# Patient Record
Sex: Female | Born: 1995
Health system: Southern US, Community
[De-identification: ages and names within clinical notes are randomized; demographics above are authoritative.]

## PROBLEM LIST (undated history)

## (undated) ENCOUNTER — Ambulatory Visit: Disposition: A | Discharge status: Home / Self Care | Payer: Self-pay

## (undated) DIAGNOSIS — D649 Anemia, unspecified: Secondary | ICD-10-CM

## (undated) DIAGNOSIS — R0602 Shortness of breath: Secondary | ICD-10-CM

## (undated) DIAGNOSIS — E282 Polycystic ovarian syndrome: Secondary | ICD-10-CM

## (undated) DIAGNOSIS — J302 Other seasonal allergic rhinitis: Secondary | ICD-10-CM

## (undated) DIAGNOSIS — M2550 Pain in unspecified joint: Secondary | ICD-10-CM

## (undated) DIAGNOSIS — R600 Localized edema: Secondary | ICD-10-CM

## (undated) DIAGNOSIS — R7303 Prediabetes: Secondary | ICD-10-CM

## (undated) DIAGNOSIS — M549 Dorsalgia, unspecified: Secondary | ICD-10-CM

## (undated) DIAGNOSIS — J45909 Unspecified asthma, uncomplicated: Secondary | ICD-10-CM

## (undated) HISTORY — DX: Pain in unspecified joint: M25.50

## (undated) HISTORY — DX: Anemia, unspecified: D64.9

## (undated) HISTORY — DX: Shortness of breath: R06.02

## (undated) HISTORY — DX: Localized edema: R60.0

## (undated) HISTORY — DX: Prediabetes: R73.03

## (undated) HISTORY — DX: Dorsalgia, unspecified: M54.9

---
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
## 2016-01-26 ENCOUNTER — Inpatient Hospital Stay (HOSPITAL_COMMUNITY)
Admission: AD | Admit: 2016-01-26 | Discharge: 2016-01-26 | Disposition: A | Discharge status: Home / Self Care | Payer: Medicaid Other | Source: Ambulatory Visit | Attending: Family Medicine | Admitting: Family Medicine

## 2016-01-26 ENCOUNTER — Encounter (HOSPITAL_COMMUNITY): Payer: Self-pay | Admitting: *Deleted

## 2016-01-26 DIAGNOSIS — Z3202 Encounter for pregnancy test, result negative: Secondary | ICD-10-CM | POA: Insufficient documentation

## 2016-01-26 DIAGNOSIS — M545 Low back pain, unspecified: Secondary | ICD-10-CM

## 2016-01-26 HISTORY — DX: Other seasonal allergic rhinitis: J30.2

## 2016-01-26 HISTORY — DX: Unspecified asthma, uncomplicated: J45.909

## 2016-01-26 LAB — POCT PREGNANCY, URINE: Preg Test, Ur: NEGATIVE
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
## 2016-01-26 NOTE — MAU Note (Signed)
Urine in lab 
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
## 2016-06-23 ENCOUNTER — Emergency Department (HOSPITAL_COMMUNITY): Payer: Medicaid Other

## 2016-06-23 ENCOUNTER — Encounter (HOSPITAL_COMMUNITY): Payer: Self-pay | Admitting: Emergency Medicine

## 2016-06-23 ENCOUNTER — Emergency Department (HOSPITAL_COMMUNITY)
Admission: EM | Admit: 2016-06-23 | Discharge: 2016-06-23 | Disposition: A | Discharge status: Home / Self Care | Payer: Medicaid Other | Attending: Emergency Medicine | Admitting: Emergency Medicine

## 2016-06-23 DIAGNOSIS — B349 Viral infection, unspecified: Secondary | ICD-10-CM | POA: Diagnosis not present

## 2016-06-23 DIAGNOSIS — R51 Headache: Secondary | ICD-10-CM | POA: Diagnosis present

## 2016-06-23 DIAGNOSIS — J111 Influenza due to unidentified influenza virus with other respiratory manifestations: Secondary | ICD-10-CM | POA: Diagnosis not present

## 2016-06-23 DIAGNOSIS — R110 Nausea: Secondary | ICD-10-CM | POA: Diagnosis not present

## 2016-06-23 DIAGNOSIS — R0981 Nasal congestion: Secondary | ICD-10-CM

## 2016-06-23 DIAGNOSIS — R519 Headache, unspecified: Secondary | ICD-10-CM

## 2016-06-23 DIAGNOSIS — R69 Illness, unspecified: Secondary | ICD-10-CM

## 2016-06-23 DIAGNOSIS — J45909 Unspecified asthma, uncomplicated: Secondary | ICD-10-CM | POA: Diagnosis not present

## 2016-06-23 LAB — CBC WITH DIFFERENTIAL/PLATELET
BASOS ABS: 0 10*3/uL (ref 0.0–0.1)
BASOS PCT: 0 %
EOS ABS: 0.1 10*3/uL (ref 0.0–0.7)
EOS PCT: 1 %
HCT: 35.6 % — ABNORMAL LOW (ref 36.0–46.0)
Hemoglobin: 11.5 g/dL — ABNORMAL LOW (ref 12.0–15.0)
Lymphocytes Relative: 16 %
Lymphs Abs: 2.4 10*3/uL (ref 0.7–4.0)
MCH: 24.3 pg — ABNORMAL LOW (ref 26.0–34.0)
MCHC: 32.3 g/dL (ref 30.0–36.0)
MCV: 75.3 fL — AB (ref 78.0–100.0)
Monocytes Absolute: 1.5 10*3/uL — ABNORMAL HIGH (ref 0.1–1.0)
Monocytes Relative: 10 %
Neutro Abs: 11.4 10*3/uL — ABNORMAL HIGH (ref 1.7–7.7)
Neutrophils Relative %: 73 %
PLATELETS: 348 10*3/uL (ref 150–400)
RBC: 4.73 MIL/uL (ref 3.87–5.11)
RDW: 16 % — ABNORMAL HIGH (ref 11.5–15.5)
WBC: 15.4 10*3/uL — AB (ref 4.0–10.5)

## 2016-06-23 LAB — COMPREHENSIVE METABOLIC PANEL
ALBUMIN: 4.1 g/dL (ref 3.5–5.0)
ALK PHOS: 84 U/L (ref 38–126)
ALT: 14 U/L (ref 14–54)
AST: 17 U/L (ref 15–41)
Anion gap: 9 (ref 5–15)
BILIRUBIN TOTAL: 0.4 mg/dL (ref 0.3–1.2)
BUN: 8 mg/dL (ref 6–20)
CALCIUM: 8.9 mg/dL (ref 8.9–10.3)
CO2: 24 mmol/L (ref 22–32)
CREATININE: 0.73 mg/dL (ref 0.44–1.00)
Chloride: 106 mmol/L (ref 101–111)
GFR calc Af Amer: >60 mL/min (ref >60)
GLUCOSE: 103 mg/dL — AB (ref 65–99)
POTASSIUM: 3.4 mmol/L — AB (ref 3.5–5.1)
Sodium: 139 mmol/L (ref 135–145)
TOTAL PROTEIN: 7.7 g/dL (ref 6.5–8.1)

## 2016-06-23 LAB — URINALYSIS, ROUTINE W REFLEX MICROSCOPIC
BILIRUBIN URINE: NEGATIVE
GLUCOSE, UA: NEGATIVE mg/dL
KETONES UR: NEGATIVE mg/dL
LEUKOCYTES UA: NEGATIVE
Nitrite: NEGATIVE
PH: 6 (ref 5.0–8.0)
Protein, ur: 30 mg/dL — AB
Specific Gravity, Urine: 1.018 (ref 1.005–1.030)

## 2016-06-23 LAB — I-STAT BETA HCG BLOOD, ED (MC, WL, AP ONLY): I-stat hCG, quantitative: <5 m[IU]/mL (ref <5)

## 2016-06-23 LAB — TROPONIN I: Troponin I: <0.03 ng/mL (ref <0.03)

## 2016-06-23 LAB — RAPID STREP SCREEN (MED CTR MEBANE ONLY): STREPTOCOCCUS, GROUP A SCREEN (DIRECT): NEGATIVE

## 2016-06-23 MED ORDER — ONDANSETRON 4 MG PO TBDP: 4.0000 mg | ORAL_TABLET | Freq: Three times a day (TID) | ORAL | 0 refills | Status: DC | PRN

## 2016-06-23 MED ORDER — DICYCLOMINE HCL 20 MG PO TABS: 20.0000 mg | ORAL_TABLET | Freq: Two times a day (BID) | ORAL | 0 refills | Status: DC | PRN

## 2016-06-23 MED ORDER — SODIUM CHLORIDE 0.9 % IV BOLUS (SEPSIS)
1000.0000 mL | Freq: Once | INTRAVENOUS | Status: AC
  Administered 2016-06-23: 1000 mL via INTRAVENOUS

## 2016-06-23 MED ORDER — KETOROLAC TROMETHAMINE 30 MG/ML IJ SOLN
30.0000 mg | Freq: Once | INTRAMUSCULAR | Status: AC
  Administered 2016-06-23: 30 mg via INTRAVENOUS
  Filled 2016-06-23: qty 1

## 2016-06-23 NOTE — ED Triage Notes (Signed)
Pt c/o sudden onset temporal bilateral head pain, post-auricular pain, severe pain with swallowing, chills and sweating at night, epistaxis, low back spasms, LLQ abdominal pain onset Monday some emesis and diarrhea yesterday.

## 2016-06-23 NOTE — ED Notes (Signed)
Pt is aware that a urine sample is needed but is unable to obtain one at this time. 

## 2016-06-23 NOTE — ED Provider Notes (Signed)
[facility]-EMERGENCY DEPT Provider Note   CSN: [id number] Arrival date & time: 06/23/16  0707     History   Chief Complaint Chief Complaint  Patient presents with  . Headache  . Abdominal Pain  . Sore Throat    HPI [name] is a 21 y.o. female.  HPI  Frontal headache and pain to ears. If turn head it hurts. Severe pain with swallowing.  Congestion severe.  Chills and back spasms, left sided abdominal pain.  Started on Monday.  No fevers measured but has had chills.  Symptoms gradually worsening.  No cough but feels dyspnea, feels like when she is going to start wheezing. Normally if start wheezing needs to cough but this feel same.  Reports congestion.  No sick contacts.  Abdominal pain feels like cramps, also had some diarrhea yesterday.  Hurts to swallow, trying to stay hydrated but difficult.  Took some ibuprofen.          Past Medical History:  Diagnosis Date  . Asthma   . PCOS (polycystic ovarian syndrome)   . Seasonal allergies     There are no active problems to display for this patient.   Past Surgical History:  Procedure Laterality Date  . FEMUR FRACTURE SURGERY  2010    OB History    Gravida Para Term Preterm AB Living   0 0 0 0 0 0   SAB TAB Ectopic Multiple Live Births   0 0 0 0         Home Medications    Prior to Admission medications   Medication Sig Start Date End Date Taking? Authorizing Provider  albuterol (PROVENTIL HFA;VENTOLIN HFA) 108 (90 Base) MCG/ACT inhaler Inhale 2 puffs into the lungs every 6 (six) hours as needed for wheezing or shortness of breath.   Yes [provider]  ibuprofen (ADVIL,MOTRIN) 200 MG tablet Take 1,000 mg by mouth every 6 (six) hours as needed for fever, headache, mild pain, moderate pain or cramping.   Yes [provider]  dicyclomine (BENTYL) 20 MG tablet Take 1 tablet (20 mg total) by mouth 2 (two) times daily as needed for spasms (abdominal pain). 06/23/16   Alvira MondaySchlossman, Shaunie Boehm, MD    norgestimate-ethinyl estradiol (ORTHO-CYCLEN,SPRINTEC,PREVIFEM) 0.25-35 MG-MCG tablet Take 1 tablet by mouth daily. Patient not taking: Reported on 06/23/2016 07/10/15   Constant, Peggy, MD  ondansetron (ZOFRAN ODT) 4 MG disintegrating tablet Take 1 tablet (4 mg total) by mouth every 8 (eight) hours as needed for nausea or vomiting. 06/23/16   Alvira MondaySchlossman, Rasa Degrazia, MD    Family History Family History  Problem Relation Age of Onset  . Diabetes Mother   . Diabetes Father   . Hypertension Father   . Cancer Other     Social History Social History  Substance Use Topics  . Smoking status: Never Smoker  . Smokeless tobacco: Never Used  . Alcohol use No     Allergies   Patient has no known allergies.   Review of Systems Review of Systems  Constitutional: Positive for appetite change, chills and fatigue. Negative for fever.  HENT: Positive for congestion (yellow-green, bloody) and sore throat.   Eyes: Negative for visual disturbance.  Respiratory: Positive for shortness of breath. Negative for cough.   Cardiovascular: Negative for chest pain.  Gastrointestinal: Positive for abdominal pain, diarrhea, nausea and vomiting (yesterday). Negative for constipation.  Genitourinary: Positive for vaginal bleeding (on menses). Negative for difficulty urinating and vaginal discharge.  Musculoskeletal: Negative for back pain and neck  pain.  Skin: Negative for rash.  Neurological: Negative for syncope and headaches.     Physical Exam Updated Vital Signs BP 116/68 (BP Location: Right Arm)   Pulse 78   Temp 98.6 F (37 C) (Oral)   Resp 16   Ht 5\' 2"  (1.575 m)   Wt 280 lb (127 kg)   LMP 06/22/2016   SpO2 100%   BMI 51.21 kg/m   Physical Exam  Constitutional: She is oriented to person, place, and time. She appears well-developed and well-nourished. No distress.  HENT:  Head: Normocephalic and atraumatic.  Eyes: Conjunctivae and EOM are normal.  Neck: Normal range of motion.   Cardiovascular: Normal rate, regular rhythm, normal heart sounds and intact distal pulses.  Exam reveals no gallop and no friction rub.   No murmur heard. Pulmonary/Chest: Effort normal and breath sounds normal. No respiratory distress. She has no wheezes. She has no rales.  Abdominal: Soft. She exhibits no distension. There is no tenderness. There is no guarding.  Musculoskeletal: She exhibits no edema or tenderness.  Neurological: She is alert and oriented to person, place, and time.  Skin: Skin is warm and dry. No rash noted. She is not diaphoretic. No erythema.  Nursing note and vitals reviewed.    ED Treatments / Results  Labs (all labs ordered are listed, but only abnormal results are displayed) Labs Reviewed  COMPREHENSIVE METABOLIC PANEL - Abnormal; Notable for the following:       Result Value   Potassium 3.4 (*)    Glucose, Bld 103 (*)    All other components within normal limits  CBC WITH DIFFERENTIAL/PLATELET - Abnormal; Notable for the following:    WBC 15.4 (*)    Hemoglobin 11.5 (*)    HCT 35.6 (*)    MCV 75.3 (*)    MCH 24.3 (*)    RDW 16.0 (*)    Neutro Abs 11.4 (*)    Monocytes Absolute 1.5 (*)    All other components within normal limits  URINALYSIS, ROUTINE W REFLEX MICROSCOPIC - Abnormal; Notable for the following:    APPearance HAZY (*)    Hgb urine dipstick LARGE (*)    Protein, ur 30 (*)    Bacteria, UA RARE (*)    Squamous Epithelial / LPF 0-5 (*)    All other components within normal limits  RAPID STREP SCREEN (NOT AT [facility])  CULTURE, GROUP A STREP [facility])  TROPONIN I  I-STAT BETA HCG BLOOD, ED ([facility], [facility], [facility] ONLY)    EKG  EKG Interpretation  Date/Time:  Wednesday Jun 23 2016 10:45:00 EDT Ventricular Rate:  98 PR Interval:    QRS Duration: 82 QT Interval:  427 QTC Calculation: 546 R Axis:   66 Text Interpretation:  Sinus rhythm Borderline T abnormalities, anterior leads Prolonged QT interval No significant change since last tracing Confirmed  by [facility] MD, Nichael Ehly ([id number]) on 06/23/2016 10:51:07 AM       Radiology Dg Chest 2 View  Result Date: 06/23/2016 CLINICAL DATA:  Short of breath EXAM: CHEST  2 VIEW COMPARISON:  05/03/2013 FINDINGS: Cardiac enlargement with normal vascularity. Lungs are clear without infiltrate effusion or mass. IMPRESSION: Cardiac enlargement.  No acute abnormality. Electronically Signed   By: Marlan Palau M.D.   On: 06/23/2016 09:18    Procedures Procedures (including critical care time)  Medications Ordered in ED Medications  sodium chloride 0.9 % bolus 1,000 mL (0 mLs Intravenous Stopped 06/23/16 1229)  ketorolac (TORADOL) 30 MG/ML injection 30 mg (  30 mg Intravenous Given 06/23/16 0901)     Initial Impression / Assessment and Plan / ED Course  I have reviewed the triage vital signs and the nursing notes.  Pertinent labs & imaging results that were available during my care of the patient were reviewed by me and considered in my medical decision making (see chart for details).     21 year old female with a history of asthma and PCOS presents with concern for chills, congestion, headache abdominal pain, nausea, diarrhea and back pain. Have low suspicion for meningitis given full range of motion of the neck, no nuchal rigidity, negative Kernig and Brudzinski's, normal mentation, multiple other symptoms. No sign of pneumonia or UTI on XR or urinalysis.  Strep screen done and negative.  Initial tachycardia which improved with fluids, likely secondary to dehydration. EKG and troponin show no acute abnormalities, no sign myocarditis. Given duration of symptoms, lack of myalgias, feel treatment with tamiflu for possible influenza not indicated.  Abdominal exam benign, hx not consistent with appendicitis, diverticulitis, TOA< PID, torsion, nephrolithiasis.  Pt likely with viral syndrome given constellation of symptoms. Given rx for bentyl, zofran. Patient discharged in stable condition with understanding of reasons  to return.   Final Clinical Impressions(s) / ED Diagnoses   Final diagnoses:  Viral syndrome  Influenza-like illness  Nausea  Acute nonintractable headache, unspecified headache type  Nasal congestion    New Prescriptions Discharge Medication List as of 06/23/2016 12:25 PM    START taking these medications   Details  dicyclomine (BENTYL) 20 MG tablet Take 1 tablet (20 mg total) by mouth 2 (two) times daily as needed for spasms (abdominal pain)., Starting Wed 06/23/2016, Print    ondansetron (ZOFRAN ODT) 4 MG disintegrating tablet Take 1 tablet (4 mg total) by mouth every 8 (eight) hours as needed for nausea or vomiting., Starting Wed 06/23/2016, Print         Alvira Monday, MD 06/24/16 [phone]

## 2016-06-25 LAB — CULTURE, GROUP A STREP (THRC)

## 2016-07-25 ENCOUNTER — Emergency Department (HOSPITAL_COMMUNITY)
Admission: EM | Admit: 2016-07-25 | Discharge: 2016-07-26 | Disposition: A | Discharge status: Home / Self Care | Payer: Medicaid Other | Attending: Emergency Medicine | Admitting: Emergency Medicine

## 2016-07-25 ENCOUNTER — Encounter (HOSPITAL_COMMUNITY): Payer: Self-pay

## 2016-07-25 DIAGNOSIS — R109 Unspecified abdominal pain: Secondary | ICD-10-CM | POA: Diagnosis present

## 2016-07-25 DIAGNOSIS — J45909 Unspecified asthma, uncomplicated: Secondary | ICD-10-CM | POA: Insufficient documentation

## 2016-07-25 DIAGNOSIS — Z791 Long term (current) use of non-steroidal anti-inflammatories (NSAID): Secondary | ICD-10-CM | POA: Diagnosis not present

## 2016-07-25 DIAGNOSIS — R1084 Generalized abdominal pain: Secondary | ICD-10-CM | POA: Diagnosis not present

## 2016-07-25 DIAGNOSIS — Z793 Long term (current) use of hormonal contraceptives: Secondary | ICD-10-CM | POA: Diagnosis not present

## 2016-07-25 LAB — COMPREHENSIVE METABOLIC PANEL
ALT: 17 U/L (ref 14–54)
AST: 15 U/L (ref 15–41)
Albumin: 4.1 g/dL (ref 3.5–5.0)
Alkaline Phosphatase: 83 U/L (ref 38–126)
Anion gap: 7 (ref 5–15)
BUN: 14 mg/dL (ref 6–20)
CHLORIDE: 103 mmol/L (ref 101–111)
CO2: 28 mmol/L (ref 22–32)
CREATININE: 0.75 mg/dL (ref 0.44–1.00)
Calcium: 8.7 mg/dL — ABNORMAL LOW (ref 8.9–10.3)
GFR calc Af Amer: >60 mL/min (ref >60)
Glucose, Bld: 102 mg/dL — ABNORMAL HIGH (ref 65–99)
Potassium: 3.9 mmol/L (ref 3.5–5.1)
Sodium: 138 mmol/L (ref 135–145)
TOTAL PROTEIN: 8.1 g/dL (ref 6.5–8.1)
Total Bilirubin: 0.5 mg/dL (ref 0.3–1.2)

## 2016-07-25 LAB — URINALYSIS, ROUTINE W REFLEX MICROSCOPIC
Bilirubin Urine: NEGATIVE
Glucose, UA: NEGATIVE mg/dL
Hgb urine dipstick: NEGATIVE
KETONES UR: NEGATIVE mg/dL
LEUKOCYTES UA: NEGATIVE
NITRITE: NEGATIVE
PROTEIN: NEGATIVE mg/dL
Specific Gravity, Urine: 1.032 — ABNORMAL HIGH (ref 1.005–1.030)
pH: 6 (ref 5.0–8.0)

## 2016-07-25 LAB — CBC
HCT: 35.9 % — ABNORMAL LOW (ref 36.0–46.0)
Hemoglobin: 11.6 g/dL — ABNORMAL LOW (ref 12.0–15.0)
MCH: 24.3 pg — ABNORMAL LOW (ref 26.0–34.0)
MCHC: 32.3 g/dL (ref 30.0–36.0)
MCV: 75.3 fL — AB (ref 78.0–100.0)
PLATELETS: 340 10*3/uL (ref 150–400)
RBC: 4.77 MIL/uL (ref 3.87–5.11)
RDW: 15.7 % — AB (ref 11.5–15.5)
WBC: 9.5 10*3/uL (ref 4.0–10.5)

## 2016-07-25 LAB — LIPASE, BLOOD: LIPASE: 17 U/L (ref 11–51)

## 2016-07-25 LAB — POC URINE PREG, ED: PREG TEST UR: NEGATIVE

## 2016-07-25 NOTE — ED Triage Notes (Signed)
States since Friday abdominal pain and back pain with dysuria voiced alert and oriented x 3 no vaginal discharge no fever noted.

## 2016-07-25 NOTE — ED Notes (Signed)
Bed: WA05 Expected date:  Expected time:  Means of arrival:  Comments: 

## 2016-07-26 ENCOUNTER — Emergency Department (HOSPITAL_COMMUNITY): Payer: Medicaid Other

## 2016-07-26 ENCOUNTER — Encounter (HOSPITAL_COMMUNITY): Payer: Self-pay

## 2016-07-26 MED ORDER — FAMOTIDINE IN NACL 20-0.9 MG/50ML-% IV SOLN
20.0000 mg | Freq: Once | INTRAVENOUS | Status: AC
  Administered 2016-07-26: 20 mg via INTRAVENOUS
  Filled 2016-07-26: qty 50

## 2016-07-26 MED ORDER — IOPAMIDOL (ISOVUE-300) INJECTION 61%
INTRAVENOUS | Status: AC
  Administered 2016-07-26: 100 mL via INTRAVENOUS
  Filled 2016-07-26: qty 100

## 2016-07-26 MED ORDER — PROMETHAZINE HCL 25 MG PO TABS: 25.0000 mg | ORAL_TABLET | Freq: Three times a day (TID) | ORAL | 0 refills | Status: DC | PRN

## 2016-07-26 MED ORDER — TRAMADOL HCL 50 MG PO TABS: 50.0000 mg | ORAL_TABLET | Freq: Four times a day (QID) | ORAL | 0 refills | Status: DC | PRN

## 2016-07-26 MED ORDER — METHYLPREDNISOLONE SODIUM SUCC 125 MG IJ SOLR
125.0000 mg | Freq: Once | INTRAMUSCULAR | Status: AC
  Administered 2016-07-26: 125 mg via INTRAVENOUS
  Filled 2016-07-26: qty 2

## 2016-07-26 MED ORDER — EPINEPHRINE 0.3 MG/0.3ML IJ SOAJ
INTRAMUSCULAR | Status: AC
  Administered 2016-07-26: 0.3 mg
  Filled 2016-07-26: qty 0.3

## 2016-07-26 MED ORDER — DIPHENHYDRAMINE HCL 50 MG/ML IJ SOLN
25.0000 mg | Freq: Once | INTRAMUSCULAR | Status: AC
  Administered 2016-07-26: 25 mg via INTRAVENOUS
  Filled 2016-07-26: qty 1

## 2016-07-26 MED ORDER — EPINEPHRINE 0.3 MG/0.3ML IJ SOAJ
0.3000 mg | INTRAMUSCULAR | Status: AC
  Administered 2016-07-26: 0.3 mg via INTRAMUSCULAR

## 2016-07-26 MED ORDER — SODIUM CHLORIDE 0.9 % IV BOLUS (SEPSIS)
1000.0000 mL | Freq: Once | INTRAVENOUS | Status: AC
Start: 2016-07-26 — End: 2016-07-26
  Administered 2016-07-26: 1000 mL via INTRAVENOUS

## 2016-07-26 MED ORDER — PREDNISONE 50 MG PO TABS: 50.0000 mg | ORAL_TABLET | Freq: Every day | ORAL | 0 refills | Status: DC

## 2016-07-26 MED ORDER — IOPAMIDOL (ISOVUE-300) INJECTION 61%
100.0000 mL | Freq: Once | INTRAVENOUS | Status: AC | PRN
  Administered 2016-07-26: 100 mL via INTRAVENOUS

## 2016-07-26 NOTE — Discharge Instructions (Signed)
Return here as needed.  Follow-up with your primary doctor. °

## 2016-07-26 NOTE — ED Provider Notes (Signed)
[facility]-EMERGENCY DEPT Provider Note   CSN: [id number] Arrival date & time: 07/25/16  2053     History   Chief Complaint Chief Complaint  Patient presents with  . Abdominal Pain    HPI [name] is a 21 y.o. female.  HPI Patient presents to the emergency department with abdominal pain started 2 days ago.  Patient states that she has also had some back discomfort with dysuria.  Patient, states she has not had any vaginal bleeding or discharge.  Patient states that she has diffuse abdominal pain and is not one specific area that she can pinpoint that is worse painful than another. The patient denies chest pain, shortness of breath, headache,blurred vision, neck pain, fever, cough, weakness, numbness, dizziness, anorexia, edema,nausea, vomiting, diarrhea, rash, back pain, dysuria, hematemesis, bloody stool, near syncope, or syncope.  Patient did not take any medications prior to arrival for her symptoms.  Nothing seems make the condition better or worse Past Medical History:  Diagnosis Date  . Asthma   . PCOS (polycystic ovarian syndrome)   . Seasonal allergies     There are no active problems to display for this patient.   Past Surgical History:  Procedure Laterality Date  . FEMUR FRACTURE SURGERY  2010    OB History    Gravida Para Term Preterm AB Living   0 0 0 0 0 0   SAB TAB Ectopic Multiple Live Births   0 0 0 0         Home Medications    Prior to Admission medications   Medication Sig Start Date End Date Taking? Authorizing Provider  albuterol (PROVENTIL HFA;VENTOLIN HFA) 108 (90 Base) MCG/ACT inhaler Inhale 2 puffs into the lungs every 6 (six) hours as needed for wheezing or shortness of breath.   Yes [provider]  ibuprofen (ADVIL,MOTRIN) 200 MG tablet Take 1,000 mg by mouth every 6 (six) hours as needed for fever, headache, mild pain, moderate pain or cramping.   Yes [provider]  dicyclomine (BENTYL) 20 MG tablet Take 1 tablet (20  mg total) by mouth 2 (two) times daily as needed for spasms (abdominal pain). Patient not taking: Reported on 07/26/2016 06/23/16   Alvira Monday, MD  norgestimate-ethinyl estradiol (ORTHO-CYCLEN,SPRINTEC,PREVIFEM) 0.25-35 MG-MCG tablet Take 1 tablet by mouth daily. Patient not taking: Reported on 06/23/2016 07/10/15   Constant, Peggy, MD  ondansetron (ZOFRAN ODT) 4 MG disintegrating tablet Take 1 tablet (4 mg total) by mouth every 8 (eight) hours as needed for nausea or vomiting. Patient not taking: Reported on 07/26/2016 06/23/16   Alvira Monday, MD  predniSONE (DELTASONE) 50 MG tablet Take 1 tablet (50 mg total) by mouth daily. 07/26/16   Emmelyn Schmale, Cristal Deer, PA-C  promethazine (PHENERGAN) 25 MG tablet Take 1 tablet (25 mg total) by mouth every 8 (eight) hours as needed for nausea or vomiting. 07/26/16   Angellica Maddison, Cristal Deer, PA-C  traMADol (ULTRAM) 50 MG tablet Take 1 tablet (50 mg total) by mouth every 6 (six) hours as needed for severe pain. 07/26/16   Charlestine Night, PA-C    Family History Family History  Problem Relation Age of Onset  . Diabetes Mother   . Diabetes Father   . Hypertension Father   . Cancer Other     Social History Social History  Substance Use Topics  . Smoking status: Never Smoker  . Smokeless tobacco: Never Used  . Alcohol use No     Allergies   Isovue [iopamidol]   Review of  Systems Review of Systems  All other systems negative except as documented in the HPI. All pertinent positives and negatives as reviewed in the HPI. Physical Exam Updated Vital Signs BP 128/66 (BP Location: Right Arm)   Pulse (!) 102   Temp 98.2 F (36.8 C) (Oral)   Resp 18   Ht 5\' 2"  (1.575 m)   Wt 127 kg (280 lb)   SpO2 97%   BMI 51.21 kg/m   Physical Exam  Constitutional: She is oriented to person, place, and time. She appears well-developed and well-nourished. No distress.  HENT:  Head: Normocephalic and atraumatic.  Mouth/Throat: Oropharynx is clear and moist.    Eyes: Pupils are equal, round, and reactive to light.  Neck: Normal range of motion. Neck supple.  Cardiovascular: Normal rate, regular rhythm and normal heart sounds.  Exam reveals no gallop and no friction rub.   No murmur heard. Pulmonary/Chest: Effort normal and breath sounds normal. No respiratory distress. She has no wheezes.  Abdominal: Soft. Normal appearance and bowel sounds are normal. She exhibits no distension and no mass. There is generalized tenderness. There is no rigidity and no guarding.  Neurological: She is alert and oriented to person, place, and time. She exhibits normal muscle tone. Coordination normal.  Skin: Skin is warm and dry. Capillary refill takes less than 2 seconds. No rash noted. No erythema.  Psychiatric: She has a normal mood and affect. Her behavior is normal.  Nursing note and vitals reviewed.    ED Treatments / Results  Labs (all labs ordered are listed, but only abnormal results are displayed) Labs Reviewed  COMPREHENSIVE METABOLIC PANEL - Abnormal; Notable for the following:       Result Value   Glucose, Bld 102 (*)    Calcium 8.7 (*)    All other components within normal limits  CBC - Abnormal; Notable for the following:    Hemoglobin 11.6 (*)    HCT 35.9 (*)    MCV 75.3 (*)    MCH 24.3 (*)    RDW 15.7 (*)    All other components within normal limits  URINALYSIS, ROUTINE W REFLEX MICROSCOPIC - Abnormal; Notable for the following:    Specific Gravity, Urine 1.032 (*)    All other components within normal limits  LIPASE, BLOOD  POC URINE PREG, ED    EKG  EKG Interpretation  Date/Time:  Sunday July 25 2016 22:02:21 EDT Ventricular Rate:  99 PR Interval:    QRS Duration: 85 QT Interval:  335 QTC Calculation: 430 R Axis:   55 Text Interpretation:  Sinus rhythm borderline T waves. no significant change since Jun 23 2016 Confirmed by Pricilla LovelessGoldston, Scott [phone]([id number]) on 07/26/2016 1:48:55 AM       Radiology Ct Abdomen Pelvis W  Contrast  Result Date: 07/26/2016 CLINICAL DATA:  Abdominal and back pain for 3 days with dysuria EXAM: CT ABDOMEN AND PELVIS WITH CONTRAST TECHNIQUE: Multidetector CT imaging of the abdomen and pelvis was performed using the standard protocol following bolus administration of intravenous contrast. Limited axial views in the delayed phase were obtained for patient motion. CONTRAST:  100 cc Isovue 300 intravenous COMPARISON:  05/10/2015 FINDINGS: Lower chest: Lung bases demonstrate no acute consolidation or effusion. The heart is upper normal in size. Hepatobiliary: No focal liver abnormality is seen. No gallstones, gallbladder wall thickening, or biliary dilatation. Pancreas: Unremarkable. No pancreatic ductal dilatation or surrounding inflammatory changes. Spleen: Normal in size without focal abnormality. Adrenals/Urinary Tract: Respiratory motion artifact obscures the lower  poles. Adrenal glands are within normal limits. No hydronephrosis. The bladder is normal. Stomach/Bowel: Stomach is within normal limits. Appendix appears normal. No evidence of bowel wall thickening, distention, or inflammatory changes. Central abdomen slightly obscured by respiratory motion. Vascular/Lymphatic: No significant vascular findings are present. No enlarged abdominal or pelvic lymph nodes. Reproductive: Uterus and bilateral adnexa are unremarkable. Other: No free air or free fluid. Musculoskeletal: No acute or significant osseous findings. IMPRESSION: Negative for acute intra-abdominal or pelvic pathology. Examination is slightly compromised by patient motion. Electronically Signed   By: Jasmine Pang M.D.   On: 07/26/2016 01:21    Procedures Procedures (including critical care time)  Medications Ordered in ED Medications  iopamidol (ISOVUE-300) 61 % injection 100 mL (100 mLs Intravenous Contrast Given 07/26/16 0037)  sodium chloride 0.9 % bolus 1,000 mL (0 mLs Intravenous Stopped 07/26/16 0139)  famotidine (PEPCID) IVPB  20 mg premix (0 mg Intravenous Stopped 07/26/16 0142)  EPINEPHrine (EPI-PEN) injection 0.3 mg (0.3 mg Intramuscular Given 07/26/16 0112)  methylPREDNISolone sodium succinate (SOLU-MEDROL) 125 mg/2 mL injection 125 mg (125 mg Intravenous Given 07/26/16 0111)  diphenhydrAMINE (BENADRYL) injection 25 mg (25 mg Intravenous Given 07/26/16 0109)  EPINEPHrine (EPI-PEN) 0.3 mg/0.3 mL injection (0.3 mg  Given 07/26/16 0101)     Initial Impression / Assessment and Plan / ED Course  I have reviewed the triage vital signs and the nursing notes.  Pertinent labs & imaging results that were available during my care of the patient were reviewed by me and considered in my medical decision making (see chart for details).     Patient had what appeared to be neurologically reaction to the IV contrast was observed over many hours.  She is given an EpiPen due to the fact that she was feeling like she is having, throat closing, and her voice seemed to change.  Patient was also given Solu-Medrol, Pepcid, Benadryl.  Patient was stable for discharge.  There is no definitive reason for abdominal pain identified on her testing or imaging  Final Clinical Impressions(s) / ED Diagnoses   Final diagnoses:  Generalized abdominal pain    New Prescriptions Discharge Medication List as of 07/26/2016  3:02 AM    START taking these medications   Details  predniSONE (DELTASONE) 50 MG tablet Take 1 tablet (50 mg total) by mouth daily., Starting Mon 07/26/2016, Print    promethazine (PHENERGAN) 25 MG tablet Take 1 tablet (25 mg total) by mouth every 8 (eight) hours as needed for nausea or vomiting., Starting Mon 07/26/2016, Print    traMADol (ULTRAM) 50 MG tablet Take 1 tablet (50 mg total) by mouth every 6 (six) hours as needed for severe pain., Starting Mon 07/26/2016, Print         Shaianne Nucci, [place], PA-C 07/26/16 1610    Pricilla Loveless, MD 07/28/16 [phone]

## 2017-03-23 ENCOUNTER — Emergency Department (HOSPITAL_COMMUNITY)
Admission: EM | Admit: 2017-03-23 | Discharge: 2017-03-23 | Disposition: A | Discharge status: Home / Self Care | Payer: Self-pay | Attending: Emergency Medicine | Admitting: Emergency Medicine

## 2017-03-23 ENCOUNTER — Emergency Department (HOSPITAL_COMMUNITY): Payer: Self-pay

## 2017-03-23 ENCOUNTER — Encounter (HOSPITAL_COMMUNITY): Payer: Self-pay | Admitting: Emergency Medicine

## 2017-03-23 DIAGNOSIS — R0602 Shortness of breath: Secondary | ICD-10-CM | POA: Insufficient documentation

## 2017-03-23 DIAGNOSIS — R42 Dizziness and giddiness: Secondary | ICD-10-CM | POA: Insufficient documentation

## 2017-03-23 DIAGNOSIS — J40 Bronchitis, not specified as acute or chronic: Secondary | ICD-10-CM | POA: Insufficient documentation

## 2017-03-23 DIAGNOSIS — R05 Cough: Secondary | ICD-10-CM | POA: Insufficient documentation

## 2017-03-23 DIAGNOSIS — R091 Pleurisy: Secondary | ICD-10-CM | POA: Insufficient documentation

## 2017-03-23 LAB — CBC
HEMATOCRIT: 37.8 % (ref 36.0–46.0)
Hemoglobin: 12.4 g/dL (ref 12.0–15.0)
MCH: 25.1 pg — ABNORMAL LOW (ref 26.0–34.0)
MCHC: 32.8 g/dL (ref 30.0–36.0)
MCV: 76.4 fL — AB (ref 78.0–100.0)
Platelets: 345 10*3/uL (ref 150–400)
RBC: 4.95 MIL/uL (ref 3.87–5.11)
RDW: 15.7 % — AB (ref 11.5–15.5)
WBC: 6.3 10*3/uL (ref 4.0–10.5)

## 2017-03-23 LAB — BASIC METABOLIC PANEL
Anion gap: 7 (ref 5–15)
BUN: 13 mg/dL (ref 6–20)
CHLORIDE: 104 mmol/L (ref 101–111)
CO2: 26 mmol/L (ref 22–32)
CREATININE: 0.68 mg/dL (ref 0.44–1.00)
Calcium: 9.1 mg/dL (ref 8.9–10.3)
GFR calc Af Amer: >60 mL/min (ref >60)
GFR calc non Af Amer: >60 mL/min (ref >60)
Glucose, Bld: 99 mg/dL (ref 65–99)
POTASSIUM: 3.8 mmol/L (ref 3.5–5.1)
Sodium: 137 mmol/L (ref 135–145)

## 2017-03-23 LAB — RAPID URINE DRUG SCREEN, HOSP PERFORMED
AMPHETAMINES: NOT DETECTED
Barbiturates: NOT DETECTED
Benzodiazepines: NOT DETECTED
COCAINE: NOT DETECTED
OPIATES: NOT DETECTED
Tetrahydrocannabinol: NOT DETECTED

## 2017-03-23 LAB — I-STAT BETA HCG BLOOD, ED (MC, WL, AP ONLY): I-stat hCG, quantitative: <5 m[IU]/mL (ref <5)

## 2017-03-23 LAB — I-STAT TROPONIN, ED
Troponin i, poc: 0 ng/mL (ref 0.00–0.08)
Troponin i, poc: 0.01 ng/mL (ref 0.00–0.08)

## 2017-03-23 LAB — D-DIMER, QUANTITATIVE: D-Dimer, Quant: <0.27 ug/mL-FEU (ref 0.00–0.50)

## 2017-03-23 LAB — TSH: TSH: 1.876 u[IU]/mL (ref 0.350–4.500)

## 2017-03-23 MED ORDER — PREDNISONE 50 MG PO TABS: 50.0000 mg | ORAL_TABLET | Freq: Every day | ORAL | 0 refills | Status: DC

## 2017-03-23 MED ORDER — KETOROLAC TROMETHAMINE 15 MG/ML IJ SOLN
15.0000 mg | Freq: Once | INTRAMUSCULAR | Status: AC
  Administered 2017-03-23: 15 mg via INTRAVENOUS
  Filled 2017-03-23: qty 1

## 2017-03-23 MED ORDER — ALBUTEROL SULFATE HFA 108 (90 BASE) MCG/ACT IN AERS
1.0000 | INHALATION_SPRAY | Freq: Four times a day (QID) | RESPIRATORY_TRACT | Status: DC | PRN
  Administered 2017-03-23: 2 via RESPIRATORY_TRACT
  Filled 2017-03-23: qty 6.7

## 2017-03-23 MED ORDER — PREDNISONE 50 MG PO TABS
50.0000 mg | ORAL_TABLET | Freq: Once | ORAL | Status: AC
  Administered 2017-03-23: 50 mg via ORAL
  Filled 2017-03-23: qty 1

## 2017-03-23 MED ORDER — NAPROXEN 500 MG PO TABS: 500.0000 mg | ORAL_TABLET | Freq: Two times a day (BID) | ORAL | 0 refills | Status: DC

## 2017-03-23 NOTE — ED Notes (Addendum)
Patient c/o chest pain.  ED Provider at bedside.

## 2017-03-23 NOTE — ED Notes (Signed)
Patient transported to X-ray 

## 2017-03-23 NOTE — ED Notes (Signed)
Patient's HR became irregular and rapid, then suddenly slowed. Highest HR 145, with normal HR of 95. EDP made aware. Patient Sinus tach during irregularity. BP also noted to be elevated, differing from earlier. EDP made aware. New orders for TSH.

## 2017-03-23 NOTE — ED Triage Notes (Signed)
Patient here from home with complaints of chest pain. Reports being woken out of sleep with chest pain non radiating, chills, dizziness when standing and walking, and cough that started 2 days ago. Diarrhea.

## 2017-03-23 NOTE — ED Notes (Signed)
Patient complaining of new onset headache. EDP made aware. No new orders given.

## 2017-03-23 NOTE — Discharge Instructions (Signed)
Follow-up with the primary care doctor as we discussed, take the medications as prescribed, return as needed for worsening symptoms

## 2017-03-23 NOTE — ED Provider Notes (Signed)
Pt initially seen by Dr Rhunette CroftNanavati.  Please see his note.   Patient presented to the emergency room for evaluation of cough congestion and chest pain.  Patient describes sharp pleuritic chest pain.  On my exam after treatment she had a very old wheeze noted.  Patient's laboratory tests are reassuring.  D-dimer is negative.  Chest x-ray without pneumonia.  I doubt pulmonary embolism, pneumonia or acute coronary syndrome.  Symptoms most likely related to a viral illness triggering some bronchospasm.  Plan on discharge home with inhaler, steroids and anti-inflammatory agents   [name], Tammy Fore, MD 03/23/17 1042

## 2017-03-23 NOTE — ED Notes (Signed)
Patient unable to provide UA at present.

## 2017-03-31 NOTE — ED Provider Notes (Signed)
[facility]-EMERGENCY DEPT Provider Note   CSN: [id number] Arrival date & time: 03/23/17  0424     History   Chief Complaint Chief Complaint  Patient presents with  . Chest Pain  . Cough  . Chills  . Dizziness    HPI [name] is a 22 y.o. female.  HPI 22 year old female comes in with chief complaint of shortness of breath and chest pain.  Patient has been having chest pain that is worse with inspiration over the past 2 days, and she has associated dry cough.  Patient is also having dizziness when she is walking, but she denies any near fainting.  Review of system is positive for shortness of breath, mostly with exertion.  Patient has history of PCOS.  Patient is not any birth control or hormonal therapy. Pt has no hx of PE, DVT and denies any exogenous hormone (testosterone / estrogen) use, long distance travels or surgery in the past 6 weeks, active cancer, recent immobilization. Patient also denies any flulike symptoms, outside of the cough.  Patient's chest pain is not positional.  Past Medical History:  Diagnosis Date  . Asthma   . PCOS (polycystic ovarian syndrome)   . Seasonal allergies     There are no active problems to display for this patient.   Past Surgical History:  Procedure Laterality Date  . FEMUR FRACTURE SURGERY  2010    OB History    Gravida Para Term Preterm AB Living   0 0 0 0 0 0   SAB TAB Ectopic Multiple Live Births   0 0 0 0         Home Medications    Prior to Admission medications   Medication Sig Start Date End Date Taking? Authorizing Provider  ibuprofen (ADVIL,MOTRIN) 200 MG tablet Take 1,000 mg by mouth every 6 (six) hours as needed for fever, headache, mild pain, moderate pain or cramping.   Yes [provider]  dicyclomine (BENTYL) 20 MG tablet Take 1 tablet (20 mg total) by mouth 2 (two) times daily as needed for spasms (abdominal pain). Patient not taking: Reported on 07/26/2016 06/23/16    Alvira Monday, MD  naproxen (NAPROSYN) 500 MG tablet Take 1 tablet (500 mg total) by mouth 2 (two) times daily. 03/23/17   Linwood Dibbles, MD  norgestimate-ethinyl estradiol (ORTHO-CYCLEN,SPRINTEC,PREVIFEM) 0.25-35 MG-MCG tablet Take 1 tablet by mouth daily. Patient not taking: Reported on 06/23/2016 07/10/15   Constant, Peggy, MD  ondansetron (ZOFRAN ODT) 4 MG disintegrating tablet Take 1 tablet (4 mg total) by mouth every 8 (eight) hours as needed for nausea or vomiting. Patient not taking: Reported on 07/26/2016 06/23/16   Alvira Monday, MD  predniSONE (DELTASONE) 50 MG tablet Take 1 tablet (50 mg total) by mouth daily. 03/23/17   Linwood Dibbles, MD  promethazine (PHENERGAN) 25 MG tablet Take 1 tablet (25 mg total) by mouth every 8 (eight) hours as needed for nausea or vomiting. Patient not taking: Reported on 03/23/2017 07/26/16   Charlestine Night, PA-C  traMADol (ULTRAM) 50 MG tablet Take 1 tablet (50 mg total) by mouth every 6 (six) hours as needed for severe pain. Patient not taking: Reported on 03/23/2017 07/26/16   Charlestine Night, PA-C    Family History Family History  Problem Relation Age of Onset  . Diabetes Mother   . Diabetes Father   . Hypertension Father   . Cancer Other     Social History Social History   Tobacco Use  . Smoking  status: Never Smoker  . Smokeless tobacco: Never Used  Substance Use Topics  . Alcohol use: No  . Drug use: No     Allergies   Isovue [iopamidol]   Review of Systems Review of Systems   Physical Exam Updated Vital Signs BP (!) 139/95   Pulse (!) 103   Temp 99.2 F (37.3 C) (Oral)   Resp 17   LMP 03/18/2017   SpO2 100%   Physical Exam  Constitutional: She is oriented to person, place, and time. She appears well-developed.  HENT:  Head: Normocephalic and atraumatic.  Eyes: EOM are normal.  Neck: Normal range of motion. Neck supple.  Cardiovascular: Intact distal pulses and normal pulses. Tachycardia present. PMI is not displaced.  Exam reveals no distant heart sounds and no friction rub.    No systolic murmur is present. Tachycardia  Pulmonary/Chest: Effort normal.  Abdominal: Bowel sounds are normal.  Neurological: She is alert and oriented to person, place, and time.  Skin: Skin is warm and dry.  Nursing note and vitals reviewed.    ED Treatments / Results  Labs (all labs ordered are listed, but only abnormal results are displayed) Labs Reviewed  CBC - Abnormal; Notable for the following components:      Result Value   MCV 76.4 (*)    MCH 25.1 (*)    RDW 15.7 (*)    All other components within normal limits  BASIC METABOLIC PANEL  D-DIMER, QUANTITATIVE (NOT AT [facility])  TSH  RAPID URINE DRUG SCREEN, HOSP PERFORMED  I-STAT TROPONIN, ED  I-STAT BETA HCG BLOOD, ED ([facility], [facility], [facility] ONLY)  I-STAT TROPONIN, ED  I-STAT TROPONIN, ED    EKG  EKG Interpretation  Date/Time:  Wednesday March 23 2017 04:54:26 EST Ventricular Rate:  122 PR Interval:    QRS Duration: 84 QT Interval:  287 QTC Calculation: 409 R Axis:   58 Text Interpretation:  Sinus tachycardia Borderline repolarization abnormality inferior and lateal twi are new No acute changes Nonspecific ST and T wave abnormality Confirmed by Derwood Kaplan ([id number]) on 03/23/2017 6:52:28 AM       Radiology No results found.  Procedures Procedures (including critical care time)  Medications Ordered in ED Medications  ketorolac (TORADOL) 15 MG/ML injection 15 mg (15 mg Intravenous Given 03/23/17 1039)  predniSONE (DELTASONE) tablet 50 mg (50 mg Oral Given 03/23/17 1039)     Initial Impression / Assessment and Plan / ED Course  I have reviewed the triage vital signs and the nursing notes.  Pertinent labs & imaging results that were available during my care of the patient were reviewed by me and considered in my medical decision making (see chart for details).     Patient comes in with chief complaint of chest pain, shortness of breath and cough.   Patient's chest pain is pleuritic in nature.  Patient has history of polycystic ovarian syndrome.  Patient does not have any PE risk factors besides high estrogen state that comes with polycystic ovarian syndrome.  Patient is also having no flulike illness.  Differential diagnosis at this time includes PE, pericarditis, myocarditis. Appropriate labs have been ordered. CT scan if positive dimer Anxiety also possible.  Final Clinical Impressions(s) / ED Diagnoses   Final diagnoses:  Pleurisy  Bronchitis    ED Discharge Orders        Ordered    predniSONE (DELTASONE) 50 MG tablet  Daily     03/23/17 1040    naproxen (NAPROSYN) 500 MG tablet  2 times daily     03/23/17 1040       Derwood KaplanNanavati, Mead Slane, MD 03/31/17 1939

## 2018-01-17 DIAGNOSIS — Z113 Encounter for screening for infections with a predominantly sexual mode of transmission: Secondary | ICD-10-CM | POA: Diagnosis not present

## 2018-01-18 DIAGNOSIS — Z124 Encounter for screening for malignant neoplasm of cervix: Secondary | ICD-10-CM | POA: Diagnosis not present

## 2018-01-18 DIAGNOSIS — E282 Polycystic ovarian syndrome: Secondary | ICD-10-CM | POA: Diagnosis not present

## 2018-01-18 DIAGNOSIS — Z3202 Encounter for pregnancy test, result negative: Secondary | ICD-10-CM | POA: Diagnosis not present

## 2018-04-11 DIAGNOSIS — Z3202 Encounter for pregnancy test, result negative: Secondary | ICD-10-CM | POA: Diagnosis not present

## 2018-07-23 ENCOUNTER — Encounter (HOSPITAL_COMMUNITY): Payer: Self-pay | Admitting: Emergency Medicine

## 2018-07-23 ENCOUNTER — Emergency Department (HOSPITAL_COMMUNITY): Payer: BC Managed Care – PPO

## 2018-07-23 ENCOUNTER — Emergency Department (HOSPITAL_COMMUNITY)
Admission: EM | Admit: 2018-07-23 | Discharge: 2018-07-24 | Disposition: A | Discharge status: Home / Self Care | Payer: BC Managed Care – PPO | Attending: Emergency Medicine | Admitting: Emergency Medicine

## 2018-07-23 ENCOUNTER — Other Ambulatory Visit: Payer: Self-pay

## 2018-07-23 DIAGNOSIS — M791 Myalgia, unspecified site: Secondary | ICD-10-CM | POA: Diagnosis not present

## 2018-07-23 DIAGNOSIS — Z79899 Other long term (current) drug therapy: Secondary | ICD-10-CM | POA: Diagnosis not present

## 2018-07-23 DIAGNOSIS — U071 COVID-19: Secondary | ICD-10-CM | POA: Insufficient documentation

## 2018-07-23 DIAGNOSIS — J45909 Unspecified asthma, uncomplicated: Secondary | ICD-10-CM | POA: Insufficient documentation

## 2018-07-23 DIAGNOSIS — R509 Fever, unspecified: Secondary | ICD-10-CM | POA: Diagnosis not present

## 2018-07-23 DIAGNOSIS — R52 Pain, unspecified: Secondary | ICD-10-CM

## 2018-07-23 DIAGNOSIS — R000 Tachycardia, unspecified: Secondary | ICD-10-CM | POA: Diagnosis not present

## 2018-07-23 DIAGNOSIS — R0602 Shortness of breath: Secondary | ICD-10-CM

## 2018-07-23 NOTE — ED Triage Notes (Signed)
Pt c/o shob x 2 days, worse last 2 hours, 102 fever at home, c/o bilat legs feeling heavy, pt is tachypneic in triage.

## 2018-07-24 LAB — CBC WITH DIFFERENTIAL/PLATELET
Abs Immature Granulocytes: 0.01 10*3/uL (ref 0.00–0.07)
Basophils Absolute: 0 10*3/uL (ref 0.0–0.1)
Basophils Relative: 1 %
Eosinophils Absolute: 0.1 10*3/uL (ref 0.0–0.5)
Eosinophils Relative: 2 %
HCT: 38.6 % (ref 36.0–46.0)
Hemoglobin: 12 g/dL (ref 12.0–15.0)
Immature Granulocytes: 0 %
Lymphocytes Relative: 11 %
Lymphs Abs: 0.7 10*3/uL (ref 0.7–4.0)
MCH: 25.8 pg — ABNORMAL LOW (ref 26.0–34.0)
MCHC: 31.1 g/dL (ref 30.0–36.0)
MCV: 82.8 fL (ref 80.0–100.0)
Monocytes Absolute: 1 10*3/uL (ref 0.1–1.0)
Monocytes Relative: 15 %
Neutro Abs: 5 10*3/uL (ref 1.7–7.7)
Neutrophils Relative %: 71 %
Platelets: 309 10*3/uL (ref 150–400)
RBC: 4.66 MIL/uL (ref 3.87–5.11)
RDW: 14 % (ref 11.5–15.5)
WBC: 6.9 10*3/uL (ref 4.0–10.5)
nRBC: 0 % (ref 0.0–0.2)

## 2018-07-24 LAB — URINALYSIS, ROUTINE W REFLEX MICROSCOPIC
Bilirubin Urine: NEGATIVE
Glucose, UA: NEGATIVE mg/dL
Hgb urine dipstick: NEGATIVE
Ketones, ur: NEGATIVE mg/dL
Leukocytes,Ua: NEGATIVE
Nitrite: NEGATIVE
Protein, ur: NEGATIVE mg/dL
Specific Gravity, Urine: 1.013 (ref 1.005–1.030)
pH: 7 (ref 5.0–8.0)

## 2018-07-24 LAB — COMPREHENSIVE METABOLIC PANEL
ALT: 23 U/L (ref 0–44)
AST: 18 U/L (ref 15–41)
Albumin: 3.8 g/dL (ref 3.5–5.0)
Alkaline Phosphatase: 77 U/L (ref 38–126)
Anion gap: 12 (ref 5–15)
BUN: 9 mg/dL (ref 6–20)
CO2: 21 mmol/L — ABNORMAL LOW (ref 22–32)
Calcium: 9 mg/dL (ref 8.9–10.3)
Chloride: 104 mmol/L (ref 98–111)
Creatinine, Ser: 0.76 mg/dL (ref 0.44–1.00)
GFR calc Af Amer: >60 mL/min (ref >60)
GFR calc non Af Amer: >60 mL/min (ref >60)
Glucose, Bld: 95 mg/dL (ref 70–99)
Potassium: 4 mmol/L (ref 3.5–5.1)
Sodium: 137 mmol/L (ref 135–145)
Total Bilirubin: 0.4 mg/dL (ref 0.3–1.2)
Total Protein: 7.4 g/dL (ref 6.5–8.1)

## 2018-07-24 LAB — D-DIMER, QUANTITATIVE: D-Dimer, Quant: 0.32 ug/mL-FEU (ref 0.00–0.50)

## 2018-07-24 LAB — TROPONIN I: Troponin I: <0.03 ng/mL (ref <0.03)

## 2018-07-24 LAB — I-STAT BETA HCG BLOOD, ED (MC, WL, AP ONLY): I-stat hCG, quantitative: <5 m[IU]/mL (ref <5)

## 2018-07-24 LAB — LACTIC ACID, PLASMA: Lactic Acid, Venous: 0.9 mmol/L (ref 0.5–1.9)

## 2018-07-24 LAB — SARS CORONAVIRUS 2 BY RT PCR (HOSPITAL ORDER, PERFORMED IN ~~LOC~~ HOSPITAL LAB): SARS Coronavirus 2: POSITIVE — AB

## 2018-07-24 MED ORDER — ACETAMINOPHEN 325 MG PO TABS
650.0000 mg | ORAL_TABLET | Freq: Once | ORAL | Status: AC
  Administered 2018-07-24: 650 mg via ORAL
  Filled 2018-07-24: qty 2

## 2018-07-24 NOTE — Discharge Instructions (Signed)
You have been seen today for fever, shortness of breath, and body aches. Please read and follow all provided instructions.   1. Medications: tylenol for fever/body aches, usual home medications 2. Treatment: rest, drink plenty of fluids 3. Follow Up: Please follow up with your primary doctor in 2 days for discussion of your diagnoses and further evaluation after today's visit; if you do not have a primary care doctor use the resource guide provided to find one; Please return to the ER for any new or worsening symptoms. Please obtain all of your results from medical records or have your doctors office obtain the results - share them with your doctor - you should be seen at your doctors office. Call today to arrange your follow up. 4. Please follow instructions for isolation. You tested positive for COVID-19. Please isolate yourself for at least 3 days since recovery without a fever, improvement in respiratory symptoms (cough, shortness of breath), and at least 7 days have passed since symptoms first appeared.     Take medications as prescribed. Please review all of the medicines and only take them if you do not have an allergy to them. Return to the emergency room for worsening condition or new concerning symptoms. Follow up with your regular doctor. If you don't have a regular doctor use one of the numbers below to establish a primary care doctor. ?  It is also a possibility that you have an allergic reaction to any of the medicines that you have been prescribed - Everybody reacts differently to medications and while MOST people have no trouble with most medicines, you may have a reaction such as nausea, vomiting, rash, swelling, shortness of breath. If this is the case, please stop taking the medicine immediately and contact your physician.  ?  You should return to the ER if you develop severe or worsening symptoms.   Emergency Department Resource Guide 1) Find a Doctor and Pay Out of  Pocket Although you won't have to find out who is covered by your insurance plan, it is a good idea to ask around and get recommendations. You will then need to call the office and see if the doctor you have chosen will accept you as a new patient and what types of options they offer for patients who are self-pay. Some doctors offer discounts or will set up payment plans for their patients who do not have insurance, but you will need to ask so you aren't surprised when you get to your appointment.  2) Contact Your Local Health Department Not all health departments have doctors that can see patients for sick visits, but many do, so it is worth a call to see if yours does. If you don't know where your local health department is, you can check in your phone book. The CDC also has a tool to help you locate your state's health department, and many state websites also have listings of all of their local health departments.  3) Find a Walk-in Clinic If your illness is not likely to be very severe or complicated, you may want to try a walk in clinic. These are popping up all over the country in pharmacies, drugstores, and shopping centers. They're usually staffed by nurse practitioners or physician assistants that have been trained to treat common illnesses and complaints. They're usually fairly quick and inexpensive. However, if you have serious medical issues or chronic medical problems, these are probably not your best option.  No Primary Care Doctor: Call Health  Connect at  [phone] - they can help you locate a primary care doctor that  accepts your insurance, provides certain services, etc. Physician Referral Service- [phone]  Chronic Pain Problems: Organization         Address  Phone   Notes  [facility]  [phone] Patients need to be referred by their primary care doctor.   Medication Assistance: Organization         Address  Phone   Notes  [facility] Medication  [facility] [address] [place], [place] [address] [phone] --Must be a resident of [facility] -- Must have NO insurance coverage whatsoever (no Medicaid/ Medicare, etc.) -- The pt. MUST have a primary care doctor that directs their care regularly and follows them in the community   [facility]  [phone]   [employer]  [phone]    Agencies that provide inexpensive medical care: Organization         Address  Phone   Notes  [place]  [phone]   [facility]    [phone]   [facility] [place], [place] [address] [phone]   [place] [address], [place] [phone]   [facility]    [phone]   [facility]    [phone]   [place] and [place] [address], [place] Phone:  [phone], Fax:  [phone] Hours of Operation:  9 am - 6 pm, M-F.  Also accepts Medicaid/Medicare and self-pay.  [facility] for [place], [address], [place] Phone: [phone], Fax: [phone]. Hours of Operation:  8:30 am - 5:30 pm, M-F.  Also accepts Medicaid and self-pay.  [facility] [place] [address], [place] Phone: [phone]   [place], [place], [place] [phone], Ext. [phone] Mondays & Thursdays: 7-9 AM.  First 15 patients are seen on a first come, first serve basis.    [place] Providers:  Organization         Address  Phone   Notes  [facility] [address], [place] [phone] Also accepts self-pay patients.  [facility] [address], [place]  [phone]   [place], [address], [place] [phone]   [facility] [address],  [place] [phone]   Lucianne Lei [address], [place]   [phone] Only accepts [place] patients after they have their name applied to their card.   Self-Pay (no insurance) in [facility]:  Organization         Address  Phone   Notes  Sickle Cell Patients, [facility] [place] [phone]   [facility] [place] [phone]   [facility]  [place], [place], [place] [phone]   [facility]/Dr. Osei-Bonsu  [address] [place] or [address], [place] [phone] Phone number for both [place] and [place] locations is the same.  [facility] [address] [place] [phone]   [facility] [address], [place] or [address] [phone]   [facility] [address] [place], [place] [phone]) [phone], phone; [phone], fax Sees patients 1st and 3rd Saturday of every month.  Must not qualify for public or private insurance (i.e. Medicaid, Medicare, [place] Health Choice, Veterans' Benefits)  Household income should be no more than 200% of the poverty level The clinic cannot treat you if you are pregnant or think you are pregnant  Sexually transmitted diseases are not treated at the clinic.

## 2018-07-24 NOTE — ED Provider Notes (Signed)
[facility]   CSN: [id number] Arrival date & time: 07/23/18  2143    History   Chief Complaint Chief Complaint  Patient presents with  . Shortness of Breath    HPI [name] is a 23 y.o. female with a PMH of Asthma, PCOS, and seasonal allergies presenting with constant shortness of breath and fever onset today. Patient reports symptoms have worsened over the last 2 hours. Patient denies taking any medications prior to arrival. Patient reports body aches and fatigue. Patient denies congestion, cough, nausea, vomiting, abdominal pain, or diarrhea. Patient denies sick contacts or recent travel. Patient denies leg pain, leg edema, or recent surgery. Patient states she was taking OCPs a few months ago, but patient has stopped. Patient reports occasional alcohol use, but denies drug or tobacco use.      HPI  Past Medical History:  Diagnosis Date  . Asthma   . PCOS (polycystic ovarian syndrome)   . Seasonal allergies     There are no active problems to display for this patient.   Past Surgical History:  Procedure Laterality Date  . FEMUR FRACTURE SURGERY  2010     OB History    Gravida  0   Para  0   Term  0   Preterm  0   AB  0   Living  0     SAB  0   TAB  0   Ectopic  0   Multiple  0   Live Births               Home Medications    Prior to Admission medications   Medication Sig Start Date End Date Taking? Authorizing Provider  dicyclomine (BENTYL) 20 MG tablet Take 1 tablet (20 mg total) by mouth 2 (two) times daily as needed for spasms (abdominal pain). Patient not taking: Reported on 07/26/2016 06/23/16   Alvira MondaySchlossman, Erin, MD  naproxen (NAPROSYN) 500 MG tablet Take 1 tablet (500 mg total) by mouth 2 (two) times daily. Patient not taking: Reported on 07/24/2018 03/23/17   Linwood DibblesKnapp, Jon, MD  norgestimate-ethinyl estradiol (ORTHO-CYCLEN,SPRINTEC,PREVIFEM) 0.25-35 MG-MCG tablet Take 1 tablet by mouth  daily. Patient not taking: Reported on 06/23/2016 07/10/15   Constant, Peggy, MD  ondansetron (ZOFRAN ODT) 4 MG disintegrating tablet Take 1 tablet (4 mg total) by mouth every 8 (eight) hours as needed for nausea or vomiting. Patient not taking: Reported on 07/26/2016 06/23/16   Alvira MondaySchlossman, Erin, MD  predniSONE (DELTASONE) 50 MG tablet Take 1 tablet (50 mg total) by mouth daily. Patient not taking: Reported on 07/24/2018 03/23/17   Linwood DibblesKnapp, Jon, MD  promethazine (PHENERGAN) 25 MG tablet Take 1 tablet (25 mg total) by mouth every 8 (eight) hours as needed for nausea or vomiting. Patient not taking: Reported on 03/23/2017 07/26/16   Charlestine NightLawyer, Christopher, PA-C  traMADol (ULTRAM) 50 MG tablet Take 1 tablet (50 mg total) by mouth every 6 (six) hours as needed for severe pain. Patient not taking: Reported on 03/23/2017 07/26/16   Charlestine NightLawyer, Christopher, PA-C    Family History Family History  Problem Relation Age of Onset  . Diabetes Mother   . Diabetes Father   . Hypertension Father   . Cancer Other     Social History Social History   Tobacco Use  . Smoking status: Never Smoker  . Smokeless tobacco: Never Used  Substance Use Topics  . Alcohol use: Yes    Comment: occ  . Drug  use: No     Allergies   Isovue [iopamidol]   Review of Systems Review of Systems  Constitutional: Positive for fatigue and fever. Negative for chills, diaphoresis and unexpected weight change.  HENT: Negative for congestion, rhinorrhea, sore throat and trouble swallowing.   Eyes: Negative for visual disturbance.  Respiratory: Positive for shortness of breath. Negative for cough, chest tightness, wheezing and stridor.   Cardiovascular: Negative for chest pain, palpitations and leg swelling.  Gastrointestinal: Negative for abdominal pain, blood in stool, diarrhea, nausea and vomiting.  Endocrine: Negative for cold intolerance and heat intolerance.  Genitourinary: Negative for dysuria.  Musculoskeletal: Positive for myalgias.  Negative for neck pain and neck stiffness.  Skin: Negative for pallor, rash and wound.  Allergic/Immunologic: Negative for environmental allergies, food allergies and immunocompromised state.  Neurological: Negative for dizziness, syncope, speech difficulty, weakness, light-headedness and numbness.  Psychiatric/Behavioral: The patient is not nervous/anxious.     Physical Exam Updated Vital Signs BP 124/64   Pulse (!) 105   Temp 99.6 F (37.6 C) (Oral)   Resp 19   LMP 07/07/2018   SpO2 98%   Physical Exam Vitals signs and nursing note reviewed.  Constitutional:      General: She is not in acute distress.    Appearance: She is well-developed. She is not diaphoretic.  HENT:     Head: Normocephalic and atraumatic.     Mouth/Throat:     Mouth: Mucous membranes are moist.     Pharynx: No pharyngeal swelling or oropharyngeal exudate.  Eyes:     Extraocular Movements: Extraocular movements intact.     Pupils: Pupils are equal, round, and reactive to light.  Neck:     Musculoskeletal: Normal range of motion and neck supple.  Cardiovascular:     Rate and Rhythm: Regular rhythm. Tachycardia present.     Heart sounds: Normal heart sounds. No murmur. No friction rub. No gallop.   Pulmonary:     Effort: Pulmonary effort is normal. Tachypnea present. No accessory muscle usage or respiratory distress.     Breath sounds: Normal breath sounds. No decreased breath sounds, wheezing, rhonchi or rales.     Comments: Patient is speaking in full sentences without difficulty. Oxygen saturation is 98% on room air.  Chest:     Chest wall: No tenderness.  Abdominal:     Palpations: Abdomen is soft.     Tenderness: There is no abdominal tenderness.  Musculoskeletal: Normal range of motion.     Right lower leg: She exhibits no tenderness. No edema.     Left lower leg: She exhibits no tenderness. No edema.  Skin:    General: Skin is warm.     Findings: No erythema or rash.  Neurological:      Mental Status: She is alert.     ED Treatments / Results  Labs (all labs ordered are listed, but only abnormal results are displayed) Labs Reviewed  SARS CORONAVIRUS 2 (HOSPITAL ORDER, [facility]) - Abnormal; Notable for the following components:      Result Value   SARS Coronavirus 2 POSITIVE (*)    All other components within normal limits  CBC WITH DIFFERENTIAL/PLATELET - Abnormal; Notable for the following components:   MCH 25.8 (*)    All other components within normal limits  COMPREHENSIVE METABOLIC PANEL - Abnormal; Notable for the following components:   CO2 21 (*)    All other components within normal limits  URINALYSIS, ROUTINE W REFLEX MICROSCOPIC -  Abnormal; Notable for the following components:   Color, Urine STRAW (*)    All other components within normal limits  CULTURE, BLOOD (ROUTINE X 2)  CULTURE, BLOOD (ROUTINE X 2)  LACTIC ACID, PLASMA  TROPONIN I  D-DIMER, QUANTITATIVE (NOT [facility])  LACTIC ACID, PLASMA  I-STAT BETA HCG BLOOD, ED ([facility], [facility], [facility] ONLY)    EKG EKG Interpretation  Date/Time:  Sunday July 23 2018 21:58:21 EDT Ventricular Rate:  123 PR Interval:  138 QRS Duration: 88 QT Interval:  280 QTC Calculation: 400 R Axis:   66 Text Interpretation:  Sinus tachycardia T wave abnormality, consider inferolateral ischemia Abnormal ECG When compared with ECG of 03/23/2017, No significant change was found Confirmed by Dione BoozeGlick, David ([id number]) on 07/23/2018 11:05:23 PM   Radiology Dg Chest 2 View  Result Date: 07/23/2018 CLINICAL DATA:  Initial evaluation for acute shortness of breath. History of asthma. EXAM: CHEST - 2 VIEW COMPARISON:  Prior radiograph from 03/23/2017 FINDINGS: Transverse heart size at the upper limits of normal, stable. Mediastinal silhouette within normal limits. Lungs normally inflated. Scattered diffuse peribronchial thickening, which could reflect sequelae of acute bronchiolitis and/or reactive airways disease. No  consolidative opacity to suggest pneumonia. Mild perihilar vascular congestion without overt pulmonary edema. No pleural effusion. No pneumothorax. No acute osseous finding. IMPRESSION: 1. Mild scattered peribronchial thickening, which could reflect sequelae of acute bronchiolitis and/or reactive airways disease. No consolidative opacity to suggest pneumonia. 2. Underlying mild perihilar vascular congestion without overt pulmonary edema. Electronically Signed   By: Rise MuBenjamin  McClintock M.D.   On: 07/23/2018 22:27    Procedures Procedures (including critical care time)  Medications Ordered in ED Medications  acetaminophen (TYLENOL) tablet 650 mg (650 mg Oral Given 07/24/18 0046)     Initial Impression / Assessment and Plan / ED Course  I have reviewed the triage vital signs and the nursing notes.  Pertinent labs & imaging results that were available during my care of the patient were reviewed by me and considered in my medical decision making (see chart for details).  Clinical Course as of Jul 24 322  Mon Jul 24, 2018  0002 CXR reveals mild scattered peribronchial thickening, which could reflect sequelae of acute bronchiolitis and/or reactive airways disease. No consolidative opacity to suggest pneumonia. Underlying mild perihilar vascular congestion without overt pulmonary edema.    DG Chest 2 View [AH]  0055 WBCs are within normal limits.  WBC: 6.9 [AH]  0157 D dimer is negative.  D-dimer, quantitative [AH]    Clinical Course User Index [AH] Leretha DykesHernandez, Lazara Grieser P, PA-C      Patient presents with fever, shortness of breath, and generalized body aches. Vitals and labs reviewed. CXR reveals mild scattered peribronchial thickening, but no opacity to suggest pneumonia. WBCs are within normal limits. Fever Tachycardia has improved with tylenol. D dimer is negative. COVID-19 test is positive. Patient is not requiring oxygen at this time. Oxygen saturations are stable. Patient does not meet  criteria for admission at this time. Discussed strict return precautions and quarantine instructions with patient. Patient signed quarantine instruction document stating she understands her responsibility to remain isolated. Patient is stable for discharge. Patient states she understands and agrees with plan.  Findings and plan of care discussed with supervising physician Dr. Nicanor AlconPalumbo.  [name] was evaluated in Emergency Department on 07/24/2018 for the symptoms described in the history of present illness. She was evaluated in the context of the global COVID-19 pandemic, which necessitated consideration that the patient  might be at risk for infection with the SARS-CoV-2 virus that causes COVID-19. Institutional protocols and algorithms that pertain to the evaluation of patients at risk for COVID-19 are in a state of rapid change based on information released by regulatory bodies including the CDC and federal and state organizations. These policies and algorithms were followed during the patient's care in the ED.   Final Clinical Impressions(s) / ED Diagnoses   Final diagnoses:  Shortness of breath  Fever in adult  Generalized body aches  COVID-19 virus detected    ED Discharge Orders    None       Leretha DykesHernandez, Meshell Abdulaziz P, [place]PA-C 07/24/18 0324    Palumbo, April, MD 07/24/18 [id number]

## 2018-07-29 LAB — CULTURE, BLOOD (ROUTINE X 2)
Culture: NO GROWTH
Culture: NO GROWTH
Special Requests: ADEQUATE

## 2018-12-28 ENCOUNTER — Ambulatory Visit (HOSPITAL_COMMUNITY)
Admission: EM | Admit: 2018-12-28 | Discharge: 2018-12-28 | Disposition: A | Discharge status: Home / Self Care | Payer: BC Managed Care – PPO | Attending: Internal Medicine | Admitting: Internal Medicine

## 2018-12-28 ENCOUNTER — Encounter (HOSPITAL_COMMUNITY): Payer: Self-pay | Admitting: Emergency Medicine

## 2018-12-28 ENCOUNTER — Other Ambulatory Visit: Payer: Self-pay

## 2018-12-28 DIAGNOSIS — Z3202 Encounter for pregnancy test, result negative: Secondary | ICD-10-CM | POA: Diagnosis not present

## 2018-12-28 DIAGNOSIS — N939 Abnormal uterine and vaginal bleeding, unspecified: Secondary | ICD-10-CM | POA: Diagnosis not present

## 2018-12-28 LAB — POCT URINALYSIS DIP (DEVICE)
Bilirubin Urine: NEGATIVE
Glucose, UA: NEGATIVE mg/dL
Hgb urine dipstick: NEGATIVE
Ketones, ur: NEGATIVE mg/dL
Nitrite: NEGATIVE
Protein, ur: NEGATIVE mg/dL
Specific Gravity, Urine: 1.025 (ref 1.005–1.030)
Urobilinogen, UA: 0.2 mg/dL (ref 0.0–1.0)
pH: 6.5 (ref 5.0–8.0)

## 2018-12-28 LAB — POCT PREGNANCY, URINE: Preg Test, Ur: NEGATIVE

## 2018-12-28 NOTE — ED Provider Notes (Signed)
[facility]    CSN: [id number] Arrival date & time: 12/28/18  1016      History   Chief Complaint Chief Complaint  Patient presents with  . Vaginal Bleeding  . Abdominal Cramping    HPI [name] is a 23 y.o. female history of PCOS-not on any medication, asthma-controlled comes to urgent care with complaints of blood on wiping when she wipes there perineal area.  She says that this happens in the intermittent fashion.  He denies any blood on her tampons in her urine.  No rash in the perineal area.  Last menstrual period was in February 2020.  No easy bruising or bleeding.  No nausea or vomiting.  No weight changes patient denies using a shaving blade for shaving.   Last pelvic exam was 2 years ago.  HPI  Past Medical History:  Diagnosis Date  . Asthma   . PCOS (polycystic ovarian syndrome)   . Seasonal allergies     There are no active problems to display for this patient.   Past Surgical History:  Procedure Laterality Date  . FEMUR FRACTURE SURGERY  2010    OB History    Gravida  0   Para  0   Term  0   Preterm  0   AB  0   Living  0     SAB  0   TAB  0   Ectopic  0   Multiple  0   Live Births               Home Medications    Prior to Admission medications   Medication Sig Start Date End Date Taking? Authorizing Provider  dicyclomine (BENTYL) 20 MG tablet Take 1 tablet (20 mg total) by mouth 2 (two) times daily as needed for spasms (abdominal pain). Patient not taking: Reported on 07/26/2016 06/23/16   Alvira MondaySchlossman, Erin, MD  naproxen (NAPROSYN) 500 MG tablet Take 1 tablet (500 mg total) by mouth 2 (two) times daily. Patient not taking: Reported on 07/24/2018 03/23/17   Linwood DibblesKnapp, Jon, MD  norgestimate-ethinyl estradiol (ORTHO-CYCLEN,SPRINTEC,PREVIFEM) 0.25-35 MG-MCG tablet Take 1 tablet by mouth daily. Patient not taking: Reported on 06/23/2016 07/10/15   Constant, Peggy, MD  ondansetron (ZOFRAN ODT) 4 MG disintegrating tablet Take 1  tablet (4 mg total) by mouth every 8 (eight) hours as needed for nausea or vomiting. Patient not taking: Reported on 07/26/2016 06/23/16   Alvira MondaySchlossman, Erin, MD  predniSONE (DELTASONE) 50 MG tablet Take 1 tablet (50 mg total) by mouth daily. Patient not taking: Reported on 07/24/2018 03/23/17   Linwood DibblesKnapp, Jon, MD  promethazine (PHENERGAN) 25 MG tablet Take 1 tablet (25 mg total) by mouth every 8 (eight) hours as needed for nausea or vomiting. Patient not taking: Reported on 03/23/2017 07/26/16   Charlestine NightLawyer, Christopher, PA-C  traMADol (ULTRAM) 50 MG tablet Take 1 tablet (50 mg total) by mouth every 6 (six) hours as needed for severe pain. Patient not taking: Reported on 03/23/2017 07/26/16   Charlestine NightLawyer, Christopher, PA-C    Family History Family History  Problem Relation Age of Onset  . Diabetes Mother   . Diabetes Father   . Hypertension Father   . Cancer Other     Social History Social History   Tobacco Use  . Smoking status: Never Smoker  . Smokeless tobacco: Never Used  Substance Use Topics  . Alcohol use: Yes    Comment: occ  . Drug use: No     Allergies  Isovue [iopamidol]   Review of Systems Review of Systems  Constitutional: Negative for activity change, fatigue, fever and unexpected weight change.  HENT: Negative.   Respiratory: Negative.   Cardiovascular: Negative.   Gastrointestinal: Negative.   Genitourinary: Positive for vaginal bleeding. Negative for dyspareunia, dysuria, flank pain, hematuria, pelvic pain, urgency and vaginal pain.  Musculoskeletal: Negative.   Skin: Negative.   Neurological: Negative.      Physical Exam Triage Vital Signs ED Triage Vitals [12/28/18 1048]  Enc Vitals Group     BP (!) 132/59     Pulse Rate (!) 102     Resp 18     Temp 98.5 F (36.9 C)     Temp Source Oral     SpO2 100 %     Weight      Height      Head Circumference      Peak Flow      Pain Score 4     Pain Loc      Pain Edu?      Excl. in [place]?    No data found.  Updated  Vital Signs BP (!) 132/59 (BP Location: Right Arm)   Pulse (!) 102   Temp 98.5 F (36.9 C) (Oral)   Resp 18   SpO2 100%   Visual Acuity Right Eye Distance:   Left Eye Distance:   Bilateral Distance:    Right Eye Near:   Left Eye Near:    Bilateral Near:     Physical Exam Vitals signs and nursing note reviewed.  Constitutional:      General: She is not in acute distress.    Appearance: She is not ill-appearing.  Pulmonary:     Effort: Pulmonary effort is normal.     Breath sounds: Normal breath sounds. No wheezing, rhonchi or rales.  Abdominal:     General: Bowel sounds are normal.     Palpations: Abdomen is soft.     Tenderness: There is no abdominal tenderness. There is no guarding or rebound.  Genitourinary:    General: Normal vulva.     Vagina: No vaginal discharge.     Rectum: Normal.  Musculoskeletal: Normal range of motion.  Skin:    General: Skin is warm.     Capillary Refill: Capillary refill takes less than 2 seconds.     Findings: No bruising or erythema.  Neurological:     General: No focal deficit present.     Mental Status: She is alert and oriented to person, place, and time.      UC Treatments / Results  Labs (all labs ordered are listed, but only abnormal results are displayed) Labs Reviewed  POCT URINALYSIS DIP (DEVICE) - Abnormal; Notable for the following components:      Result Value   Leukocytes,Ua SMALL (*)    All other components within normal limits  POCT PREGNANCY, URINE    EKG   Radiology No results found.  Procedures Procedures (including critical care time)  Medications Ordered in UC Medications - No data to display  Initial Impression / Assessment and Plan / UC Course  I have reviewed the triage vital signs and the nursing notes.  Pertinent labs & imaging results that were available during my care of the patient were reviewed by me and considered in my medical decision making (see chart for details).     1.   Perineal bleeding: Perineal exam was negative for any lesions. Patient is encouraged to monitor episodes of  such bleeds. She is encouraged to go for her annual physical with pelvic exam. If patient's symptoms gets worse she is always welcome to return to urgent care to be reevaluated. Final Clinical Impressions(s) / UC Diagnoses   Final diagnoses:  None   Discharge Instructions   None    ED Prescriptions    None     PDMP not reviewed this encounter.   Merrilee Jansky, MD 12/28/18 1155

## 2018-12-28 NOTE — ED Triage Notes (Signed)
For the last two weeks pt complains of vaginal spotting.  She states her urine is clear and she only sees blood when she wipes after urinating.  Pt has some lower abdominal cramping as well.

## 2018-12-29 LAB — URINE CULTURE: Culture: NO GROWTH

## 2019-02-26 DIAGNOSIS — N926 Irregular menstruation, unspecified: Secondary | ICD-10-CM | POA: Diagnosis not present

## 2019-02-26 DIAGNOSIS — Z113 Encounter for screening for infections with a predominantly sexual mode of transmission: Secondary | ICD-10-CM | POA: Diagnosis not present

## 2019-04-14 ENCOUNTER — Ambulatory Visit: Payer: BC Managed Care – PPO | Attending: Internal Medicine

## 2019-04-14 DIAGNOSIS — Z23 Encounter for immunization: Secondary | ICD-10-CM

## 2019-04-14 NOTE — Progress Notes (Signed)
   Covid-19 Vaccination Clinic  Name:  KAYLYN GARROW    MRN: [id number] DOB: [birth year]  04/14/2019  Ms. Egley was observed post Covid-19 immunization for 30 minutes based on pre-vaccination screening without incidence. She was provided with Vaccine Information Sheet and instruction to access the V-Safe system.   Ms. Dabney was instructed to call 911 with any severe reactions post vaccine: Marland Kitchen Difficulty breathing  . Swelling of your face and throat  . A fast heartbeat  . A bad rash all over your body  . Dizziness and weakness    Immunizations Administered    Name Date Dose VIS Date Route   Pfizer COVID-19 Vaccine 04/14/2019  2:01 PM 0.3 mL 01/26/2019 Intramuscular   Manufacturer: [employer], [employer]   Lot: [id number]   NDC: [address]-7

## 2019-05-05 ENCOUNTER — Ambulatory Visit: Payer: BC Managed Care – PPO | Attending: Internal Medicine

## 2019-05-05 DIAGNOSIS — Z23 Encounter for immunization: Secondary | ICD-10-CM

## 2019-05-05 NOTE — Progress Notes (Signed)
   Covid-19 Vaccination Clinic  Name:  [name]    MRN: [id number] DOB: 1995-12-30  05/05/2019  Ms. [name] was observed post Covid-19 immunization for 30 minutes based on pre-vaccination screening without incident. She was provided with Vaccine Information Sheet and instruction to access the V-Safe system.   Ms. [name] was instructed to call 911 with any severe reactions post vaccine: Marland Kitchen Difficulty breathing  . Swelling of face and throat  . A fast heartbeat  . A bad rash all over body  . Dizziness and weakness   Immunizations Administered    Name Date Dose VIS Date Route   Pfizer COVID-19 Vaccine 05/05/2019  2:03 PM 0.3 mL 01/26/2019 Intramuscular   Manufacturer: [employer], [employer]   Lot: [id number]   NDC: [address]-2

## 2019-06-03 ENCOUNTER — Encounter (HOSPITAL_COMMUNITY): Payer: Self-pay | Admitting: Emergency Medicine

## 2019-06-03 ENCOUNTER — Emergency Department (HOSPITAL_COMMUNITY): Payer: BC Managed Care – PPO

## 2019-06-03 ENCOUNTER — Other Ambulatory Visit: Payer: Self-pay

## 2019-06-03 ENCOUNTER — Emergency Department (HOSPITAL_COMMUNITY)
Admission: EM | Admit: 2019-06-03 | Discharge: 2019-06-03 | Disposition: A | Discharge status: Home / Self Care | Payer: BC Managed Care – PPO | Attending: Emergency Medicine | Admitting: Emergency Medicine

## 2019-06-03 DIAGNOSIS — W01198A Fall on same level from slipping, tripping and stumbling with subsequent striking against other object, initial encounter: Secondary | ICD-10-CM | POA: Insufficient documentation

## 2019-06-03 DIAGNOSIS — J45909 Unspecified asthma, uncomplicated: Secondary | ICD-10-CM | POA: Diagnosis not present

## 2019-06-03 DIAGNOSIS — Y9289 Other specified places as the place of occurrence of the external cause: Secondary | ICD-10-CM | POA: Insufficient documentation

## 2019-06-03 DIAGNOSIS — Z79899 Other long term (current) drug therapy: Secondary | ICD-10-CM | POA: Diagnosis not present

## 2019-06-03 DIAGNOSIS — Y9389 Activity, other specified: Secondary | ICD-10-CM | POA: Insufficient documentation

## 2019-06-03 DIAGNOSIS — Y999 Unspecified external cause status: Secondary | ICD-10-CM | POA: Insufficient documentation

## 2019-06-03 DIAGNOSIS — S59901A Unspecified injury of right elbow, initial encounter: Secondary | ICD-10-CM | POA: Diagnosis not present

## 2019-06-03 DIAGNOSIS — M25521 Pain in right elbow: Secondary | ICD-10-CM

## 2019-06-03 MED ORDER — ACETAMINOPHEN 500 MG PO TABS: 500.0000 mg | ORAL_TABLET | Freq: Four times a day (QID) | ORAL | 0 refills | Status: DC | PRN

## 2019-06-03 MED ORDER — IBUPROFEN 600 MG PO TABS: 600.0000 mg | ORAL_TABLET | Freq: Four times a day (QID) | ORAL | 0 refills | Status: DC | PRN

## 2019-06-03 NOTE — Discharge Instructions (Signed)
Your x-rays today showed a possible but not definitive fracture of the right elbow.  Due to your pain we will treat this with a splint and sling.  Please wear this until follow-up for repeat x-rays in 5 to 7 days.  This can be done at your student health if they offer x-rays, with primary care, urgent care, orthopedics, or in the emergency department.   I would touch base with your student health center on Monday to see if they can obtain x-rays.  Otherwise you can follow-up outpatient at the other offices mentioned above.   Alternate 600 mg of ibuprofen and [phone] mg of Tylenol every 3-6hours as needed for pain. Do not exceed 4000 mg of Tylenol daily.  Take ibuprofen with food to avoid upset stomach issues.  Keep the splint clean and dry.  Apply ice 20 minutes at a time 3 or 4 times daily and elevate to help reduce swelling.  Return to the emergency department if any concerning signs or symptoms develop such as severe swelling, severe uncontrolled pain, loss of pulses, or weakness.

## 2019-06-03 NOTE — ED Triage Notes (Signed)
Patient complaining of falling on her right elbow. Patient states she can not straighten it.

## 2019-06-03 NOTE — ED Notes (Signed)
Ortho paged again. Line ringing busy, Diplomatic Services operational officer notified to attempt to page again as well.

## 2019-06-03 NOTE — ED Provider Notes (Signed)
[facility] DEPT Provider Note   CSN: [id number] Arrival date & time: 06/03/19  0426     History Chief Complaint  Patient presents with  . Elbow Injury    right side    [name] is a 24 y.o. female with history of PCOS, asthma, allergies presents for evaluation of acute onset, persistent right elbow pain beginning yesterday afternoon. She states she accidentally slipped and struck her right elbow against a door handle.  Denies head injury or loss of consciousness.  Reports constant pain along the olecranon process, radiating up and down the extremity.  Pain worsens with extension at the elbow as well as pronation and supination.  She did have a little bit of numbness and tingling initially but this has resolved. Denies shoulder or wrist pain. Has tried Tylenol with little relief.  She is right-hand dominant.  The history is provided by the patient.       Past Medical History:  Diagnosis Date  . Asthma   . PCOS (polycystic ovarian syndrome)   . Seasonal allergies     There are no problems to display for this patient.   Past Surgical History:  Procedure Laterality Date  . FEMUR FRACTURE SURGERY  2010     OB History    Gravida  0   Para  0   Term  0   Preterm  0   AB  0   Living  0     SAB  0   TAB  0   Ectopic  0   Multiple  0   Live Births              Family History  Problem Relation Age of Onset  . Diabetes Mother   . Diabetes Father   . Hypertension Father   . Cancer Other     Social History   Tobacco Use  . Smoking status: Never Smoker  . Smokeless tobacco: Never Used  Substance Use Topics  . Alcohol use: Yes    Comment: occ  . Drug use: No    Home Medications Prior to Admission medications   Medication Sig Start Date End Date Taking? Authorizing Provider  acetaminophen (TYLENOL) 500 MG tablet Take 1 tablet (500 mg total) by mouth every 6 (six) hours as needed. 06/03/19   Margreat Widener A, PA-C   ibuprofen (ADVIL) 600 MG tablet Take 1 tablet (600 mg total) by mouth every 6 (six) hours as needed. 06/03/19   Alanny Rivers A, PA-C  dicyclomine (BENTYL) 20 MG tablet Take 1 tablet (20 mg total) by mouth 2 (two) times daily as needed for spasms (abdominal pain). Patient not taking: Reported on 07/26/2016 06/23/16 12/28/18  Gareth Morgan, MD  norgestimate-ethinyl estradiol (ORTHO-CYCLEN,SPRINTEC,PREVIFEM) 0.25-35 MG-MCG tablet Take 1 tablet by mouth daily. Patient not taking: Reported on 06/23/2016 07/10/15 12/28/18  Constant, Peggy, MD  promethazine (PHENERGAN) 25 MG tablet Take 1 tablet (25 mg total) by mouth every 8 (eight) hours as needed for nausea or vomiting. Patient not taking: Reported on 03/23/2017 07/26/16 12/28/18  Dalia Heading, PA-C    Allergies    Isovue [iopamidol]  Review of Systems   Review of Systems  Constitutional: Negative for fever.  Musculoskeletal: Positive for arthralgias.  Neurological: Positive for numbness (resolved). Negative for syncope, weakness and headaches.    Physical Exam Updated Vital Signs BP 131/81 (BP Location: Right Arm)   Pulse 100   Temp 98.8 F (37.1 C) (Oral)   Resp 18  Ht 5\' 1"  (1.549 m)   Wt 131.5 kg   LMP 05/19/2019   SpO2 99%   BMI 54.80 kg/m   Physical Exam Vitals and nursing note reviewed.  Constitutional:      General: She is not in acute distress.    Appearance: She is well-developed.  HENT:     Head: Normocephalic and atraumatic.  Eyes:     General:        Right eye: No discharge.        Left eye: No discharge.     Conjunctiva/sclera: Conjunctivae normal.  Neck:     Vascular: No JVD.     Trachea: No tracheal deviation.     Comments: No midline cervical spine tenderness, deformity, crepitus, or step-off Cardiovascular:     Rate and Rhythm: Normal rate.     Pulses: Normal pulses.     Comments: 2+ radial pulses bilaterally Pulmonary:     Effort: Pulmonary effort is normal.  Abdominal:     General: There is no  distension.  Musculoskeletal:        General: Tenderness present.     Cervical back: Normal range of motion and neck supple. No rigidity or tenderness.     Comments: Tenderness to palpation along the olecranon process of the right elbow as well as the antecubital fossa.  She has pain with extension and supination of the right elbow.  Somewhat limited range of motion due to pain.  No wrist or shoulder tenderness or swelling.  No snuffbox tenderness.  5/5 strength of bilateral upper extremity major muscle groups  Skin:    General: Skin is warm and dry.     Capillary Refill: Capillary refill takes less than 2 seconds.     Findings: No erythema.  Neurological:     Mental Status: She is alert.     Comments: Sensation intact to light touch of bilateral upper extremities.  Good grip strength bilaterally.  Psychiatric:        Behavior: Behavior normal.     ED Results / Procedures / Treatments   Labs (all labs ordered are listed, but only abnormal results are displayed) Labs Reviewed - No data to display  EKG None  Radiology DG Elbow Complete Right  Result Date: 06/03/2019 CLINICAL DATA:  Elbow pain after a fall. EXAM: RIGHT ELBOW - COMPLETE 3+ VIEW COMPARISON:  None. FINDINGS: On the lateral view, a tiny os ossific density projects adjacent the coronoid process of the ulna. No correlate is seen on other views. No joint effusion. IMPRESSION: Tiny ossific density projecting adjacent the coronoid process of the ulna on the lateral view. Given absence of joint effusion, acute fracture is felt unlikely. If symptoms persist or warrant, follow-up radiographs in 5-7 days could be performed. Electronically Signed   By: 06/05/2019 M.D.   On: 06/03/2019 05:15    Procedures Procedures (including critical care time)  Medications Ordered in ED Medications - No data to display  ED Course  I have reviewed the triage vital signs and the nursing notes.  Pertinent labs & imaging results that were  available during my care of the patient were reviewed by me and considered in my medical decision making (see chart for details).    MDM Rules/Calculators/A&P                      Patient presenting for evaluation of right elbow pain after injury yesterday afternoon.  She is afebrile, vital signs are stable.  She is nontoxic in appearance.  She is neurovascularly intact.  Physical examination somewhat limited due to pain.  Radiographs show a tiny ossific density projecting adjacent to the coronoid process of the ulna on the lateral view.  She does have some tenderness around this area and due to her more limited physical examination I feel would be best to treat this more conservatively with posterior splint and sling until she can follow-up for repeat radiographs within 5 to 7 days.  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  If not she will follow-up at urgent care or with orthopedics on an outpatient basis for repeat radiographs.  Discussed conservative therapy and management with NSAIDs, Tylenol, ice, elevation.  Discussed strict ED return precautions. Patient verbalized understanding of and agreement with plan and is safe for discharge home at this time.  Final Clinical Impression(s) / ED Diagnoses Final diagnoses:  Right elbow pain    Rx / DC Orders ED Discharge Orders         Ordered    acetaminophen (TYLENOL) 500 MG tablet  Every 6 hours PRN     06/03/19 0602    ibuprofen (ADVIL) 600 MG tablet  Every 6 hours PRN     06/03/19 0602           Jeanie Sewer, PA-C 06/03/19 7017    Dione Booze, MD 06/03/19 [phone]

## 2019-06-03 NOTE — ED Notes (Signed)
Ortho tech paged  

## 2019-06-03 NOTE — ED Notes (Signed)
Patient waiting on ortho 

## 2019-06-03 NOTE — Progress Notes (Signed)
Orthopedic Tech Progress Note Patient Details:  YARIXA LIGHTCAP 14-Jul-1995 [id number]  Ortho Devices Type of Ortho Device: Ace wrap, Post (long arm) splint, Sling immobilizer Ortho Device/Splint Location: right Ortho Device/Splint Interventions: Application   Post Interventions Patient Tolerated: Well Instructions Provided: Care of device   Saul Fordyce 06/03/2019, 11:10 AM

## 2019-06-11 DIAGNOSIS — M25521 Pain in right elbow: Secondary | ICD-10-CM | POA: Diagnosis not present

## 2019-06-14 DIAGNOSIS — E669 Obesity, unspecified: Secondary | ICD-10-CM | POA: Diagnosis not present

## 2019-06-14 DIAGNOSIS — R7303 Prediabetes: Secondary | ICD-10-CM | POA: Diagnosis not present

## 2019-08-01 DIAGNOSIS — J069 Acute upper respiratory infection, unspecified: Secondary | ICD-10-CM | POA: Diagnosis not present

## 2019-08-01 DIAGNOSIS — J988 Other specified respiratory disorders: Secondary | ICD-10-CM | POA: Diagnosis not present

## 2019-08-07 ENCOUNTER — Encounter (INDEPENDENT_AMBULATORY_CARE_PROVIDER_SITE_OTHER): Payer: Self-pay | Admitting: Family Medicine

## 2019-08-07 ENCOUNTER — Other Ambulatory Visit: Payer: Self-pay

## 2019-08-07 ENCOUNTER — Ambulatory Visit (INDEPENDENT_AMBULATORY_CARE_PROVIDER_SITE_OTHER): Payer: BC Managed Care – PPO | Admitting: Family Medicine

## 2019-08-07 VITALS — BP 115/80 | HR 91 | Temp 98.4°F | Ht 62.0 in | Wt 323.0 lb

## 2019-08-07 DIAGNOSIS — Z9189 Other specified personal risk factors, not elsewhere classified: Secondary | ICD-10-CM | POA: Diagnosis not present

## 2019-08-07 DIAGNOSIS — R0602 Shortness of breath: Secondary | ICD-10-CM

## 2019-08-07 DIAGNOSIS — E282 Polycystic ovarian syndrome: Secondary | ICD-10-CM

## 2019-08-07 DIAGNOSIS — R0683 Snoring: Secondary | ICD-10-CM

## 2019-08-07 DIAGNOSIS — R5383 Other fatigue: Secondary | ICD-10-CM

## 2019-08-07 DIAGNOSIS — Z6841 Body Mass Index (BMI) 40.0 and over, adult: Secondary | ICD-10-CM

## 2019-08-07 DIAGNOSIS — F3289 Other specified depressive episodes: Secondary | ICD-10-CM

## 2019-08-07 DIAGNOSIS — Z0289 Encounter for other administrative examinations: Secondary | ICD-10-CM

## 2019-08-07 DIAGNOSIS — R7303 Prediabetes: Secondary | ICD-10-CM

## 2019-08-07 DIAGNOSIS — D72829 Elevated white blood cell count, unspecified: Secondary | ICD-10-CM | POA: Diagnosis not present

## 2019-08-07 DIAGNOSIS — D509 Iron deficiency anemia, unspecified: Secondary | ICD-10-CM | POA: Diagnosis not present

## 2019-08-07 DIAGNOSIS — D508 Other iron deficiency anemias: Secondary | ICD-10-CM

## 2019-08-07 MED ORDER — METFORMIN HCL 500 MG PO TABS: 500.0000 mg | ORAL_TABLET | Freq: Every day | ORAL | 0 refills | Status: DC

## 2019-08-07 NOTE — Progress Notes (Signed)
Dear Tammy Nap, PA,   Thank you for referring [name] to our clinic. The following note includes my evaluation and treatment recommendations.  Chief Complaint:   OBESITY [name] (MR# [id number]) is a 24 y.o. female who presents for evaluation and treatment of obesity and related comorbidities. Current BMI is Body mass index is 59.08 kg/m. [name] has been struggling with her weight for many years and has been unsuccessful in either losing weight, maintaining weight loss, or reaching her healthy weight goal.  [name] is currently in the action stage of change and ready to dedicate time achieving and maintaining a healthier weight. [name] is interested in becoming our patient and working on intensive lifestyle modifications including (but not limited to) diet and exercise for weight loss.  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  She lives with her sister.  She is single.  She says she gets in about 8,000 steps per day.  She is interested in bariatric surgery.  [name]'s habits were reviewed today and are as follows: Her family eats meals together, she thinks her family will eat healthier with her, her desired weight loss is 107 pounds, she has been heavy most of her life, she started gaining weight at 24 years old, her heaviest weight ever was 327 pounds, she craves chips, fried foods, and chocolate, she skips breakfast frequently, she is frequently drinking liquids with calories, she frequently makes poor food choices, she frequently eats larger portions than normal and she struggles with emotional eating.  Depression Screen [name]'s Food and Mood (modified PHQ-9) score was 27.  Depression screen [name] 2/9 08/07/2019  Decreased Interest 3  Down, Depressed, Hopeless 3  PHQ - 2 Score 6  Altered sleeping 3  Tired, decreased energy 3  Change in appetite 3  Feeling bad or failure about yourself  3  Trouble concentrating 3    Moving slowly or fidgety/restless 3  Suicidal thoughts 3  PHQ-9 Score 27   Subjective:   1. Other fatigue [name] denies daytime somnolence and admits to waking up still tired. Patent has a history of symptoms of morning fatigue and snoring. [name] generally gets 6 or 7 hours of sleep per night, and states that she has poor quality sleep. Snoring is present. Apneic episodes are present. Epworth Sleepiness Score is 11.  2. SOB (shortness of breath) on exertion [name] notes increasing shortness of breath with exercising and seems to be worsening over time with weight gain. She notes getting out of breath sooner with activity than she used to. This has gotten worse recently. [name] denies shortness of breath at rest or orthopnea.  3. PCOS (polycystic ovarian syndrome) [name] has PCOS and is taking Vienva daily.  4. Prediabetes [name] has a diagnosis of prediabetes based on her elevated HgA1c and was informed this puts her at greater risk of developing diabetes. She continues to work on diet and exercise to decrease her risk of diabetes. She denies nausea or hypoglycemia.  Her A1c was 5.7 within the past month.  5. Snoring [name] endorses snoring and apneic episodes.  Epworth score is 11.  Situation Chance of Dozing or Sleeping  Sitting and reading 2 = moderate chance of dozing or sleeping  Watching television 3 = high chance of dozing or sleeping  Sitting as a passenger in a car for an hour 3 = high chance of dozing or sleeping  Sitting inactive in  a public place (theater or meeting) 0 = would never doze or sleep  Lying down in the afternoon when circumstances permit 3 = high chance of dozing or sleeping  Sitting and talking to someone 0 = would never doze or sleep  Sitting quietly after lunch without alcohol 0 = would never doze or sleep  In a car, while stopped for a few minutes in traffic 0 = would never doze or sleep  TOTAL 11   6. Other iron deficiency anemia [name] is not a  vegetarian.  She does not have a history of weight loss surgery.  Anemia is mild.  She has not started an iron supplement.  CBC Latest Ref Rng & Units 07/24/2018 03/23/2017 07/25/2016  WBC 4.0 - 10.5 K/uL 6.9 6.3 9.5  Hemoglobin 12.0 - 15.0 g/dL 10.1 75.1 11.6(L)  Hematocrit 36 - 46 % 38.6 37.8 35.9(L)  Platelets 150 - 400 K/uL 309 345 340   7. Other depression, with emotional eating [name] is struggling with emotional eating and using food for comfort to the extent that it is negatively impacting her health. She has been working on behavior modification techniques to help reduce her emotional eating and has been unsuccessful. She shows no sign of suicidal or homicidal ideations.  She previously saw RD for mindful eating when she was a child.  PHQ-9 is 27 today.  8. At risk for heart disease [name] is at a higher than average risk for cardiovascular disease due to obesity.   Assessment/Plan:   1. Other fatigue [name] does feel that her weight is causing her energy to be lower than it should be. Fatigue may be related to obesity, depression or many other causes. Labs will be ordered, and in the meanwhile, [name] will focus on self care including making healthy food choices, increasing physical activity and focusing on stress reduction.  Orders - EKG 12-Lead - VITAMIN D 25 Hydroxy (Vit-D Deficiency, Fractures) - TSH - T4, free - T3  2. SOB (shortness of breath) on exertion [name] does feel that she gets out of breath more easily that she used to when she exercises. [name]'s shortness of breath appears to be obesity related and exercise induced. She has agreed to work on weight loss and gradually increase exercise to treat her exercise induced shortness of breath. Will continue to monitor closely.  3. PCOS (polycystic ovarian syndrome) Intensive lifestyle modifications are first line treatment for this issue. We discussed several lifestyle modifications today and she will continue to work on  diet, exercise and weight loss efforts. Orders and follow up as documented in patient record.  Counseling . PCOS is a leading cause of menstrual irregularities and infertility. It is also associated with obesity, hirsutism (excessive hair growth on the face, chest, or back), and cardiovascular risk factors such as high cholesterol and insulin resistance. . Insulin resistance appears to play a central role.  . Women with PCOS have been shown to have impaired appetite-regulating hormones. . Metformin is one medication that can improve metabolic parameters.  . Women with polycystic ovary syndrome (PCOS) have an increased risk for cardiovascular disease (CVD) - European Journal of Preventive Cardiology.  4. Prediabetes [name] will continue to work on weight loss, exercise, and decreasing simple carbohydrates to help decrease the risk of diabetes.  Start metformin, as per below.  Orders - Comprehensive metabolic panel - CBC with Differential/Platelet - metFORMIN (GLUCOPHAGE) 500 MG tablet; Take 1 tablet (500 mg total) by mouth daily.  Dispense: 30 tablet; Refill:  0  5. Snoring Will refer [name] to Sleep Medicine for evaluation.  Orders - Ambulatory referral to Sleep Studies  6. Other iron deficiency anemia Orders and follow up as documented in patient record.  Counseling . Iron is essential for our bodies to make red blood cells.  Reasons that someone may be deficient include: an iron-deficient diet (more likely in those following vegan or vegetarian diets), women with heavy menses, patients with GI disorders or poor absorption, patients that have had bariatric surgery, frequent blood donors, patients with cancer, and patients with heart disease.   [name] foods include dark leafy greens, red and white meats, eggs, seafood, and beans.   . Certain foods and drinks prevent your body from absorbing iron properly. Avoid eating these foods in the same meal as iron-rich foods or with iron  supplements. These foods include: coffee, black tea, and red wine; milk, dairy products, and foods that are high in calcium; beans and soybeans; whole grains.  . Constipation can be a side effect of iron supplementation. Increased water and fiber intake are helpful. Water goal: > 2 liters/day. Fiber goal: > 25 grams/day.  Orders - Anemia panel  7. Other depression, with emotional eating Patient was referred to Dr. Mallie Mussel, our Bariatric Psychologist, for evaluation due to her elevated PHQ-9 score and significant struggles with emotional eating.  8. At risk for heart disease [name] was given approximately 15 minutes of coronary artery disease prevention counseling today. She is 24 y.o. female and has risk factors for heart disease including obesity. We discussed intensive lifestyle modifications today with an emphasis on specific weight loss instructions and strategies.   Repetitive spaced learning was employed today to elicit superior memory formation and behavioral change.  9. Class 3 severe obesity with serious comorbidity and body mass index (BMI) of 50.0 to 59.9 in adult, unspecified obesity type (HCC) [name] is currently in the action stage of change and her goal is to continue with weight loss efforts. I recommend [name] begin the structured treatment plan as follows:  She has agreed to the Category 2 Plan.  Exercise goals: No exercise has been prescribed at this time.   Behavioral modification strategies: increasing lean protein intake, decreasing simple carbohydrates, increasing vegetables, increasing water intake, decreasing liquid calories, decreasing sodium intake and increasing high fiber foods.  She was informed of the importance of frequent follow-up visits to maximize her success with intensive lifestyle modifications for her multiple health conditions. She was informed we would discuss her lab results at her next visit unless there is a critical issue that needs to be addressed  sooner. [name] agreed to keep her next visit at the agreed upon time to discuss these results.  Objective:   Blood pressure 115/80, pulse 91, temperature 98.4 F (36.9 C), temperature source Oral, height 5\' 2"  (1.575 m), weight (!) 323 lb (146.5 kg), last menstrual period 07/15/2019, SpO2 100 %. Body mass index is 59.08 kg/m.  EKG: Normal sinus rhythm, rate 91 bpm.  Indirect Calorimeter completed today shows a VO2 of 266 and a REE of 1854.  Her calculated basal metabolic rate is 4098 thus her basal metabolic rate is worse than expected.  General: Cooperative, alert, well developed, in no acute distress. HEENT: Conjunctivae and lids unremarkable. Cardiovascular: Regular rhythm.  Lungs: Normal work of breathing. Neurologic: No focal deficits.   Lab Results  Component Value Date   CREATININE 0.76 07/24/2018   BUN 9 07/24/2018   NA 137 07/24/2018   K 4.0  07/24/2018   CL 104 07/24/2018   CO2 21 (L) 07/24/2018   Lab Results  Component Value Date   ALT 23 07/24/2018   AST 18 07/24/2018   ALKPHOS 77 07/24/2018   BILITOT 0.4 07/24/2018   Lab Results  Component Value Date   TSH 1.876 03/23/2017   Lab Results  Component Value Date   WBC 6.9 07/24/2018   HGB 12.0 07/24/2018   HCT 38.6 07/24/2018   MCV 82.8 07/24/2018   PLT 309 07/24/2018   Attestation Statements:   This is the patient's first visit at [facility]. The patient's NEW PATIENT PACKET was reviewed at length. Included in the packet: current and past health history, medications, allergies, ROS, gynecologic history (women only), surgical history, family history, social history, weight history, weight loss surgery history (for those that have had weight loss surgery), nutritional evaluation, mood and food questionnaire, PHQ9, Epworth questionnaire, sleep habits questionnaire, patient life and health improvement goals questionnaire. These will all be scanned into the patient's chart under media.   During  the visit, I independently reviewed the patient's EKG, bioimpedance scale results, and indirect calorimeter results. I used this information to tailor a meal plan for the patient that will help her to lose weight and will improve her obesity-related conditions going forward. I performed a medically necessary appropriate examination and/or evaluation. I discussed the assessment and treatment plan with the patient. The patient was provided an opportunity to ask questions and all were answered. The patient agreed with the plan and demonstrated an understanding of the instructions. Labs were ordered at this visit and will be reviewed at the next visit unless more critical results need to be addressed immediately. Clinical information was updated and documented in the EMR.   I, Insurance claims handler, CMA, am acting as transcriptionist for Helane Rima, DO  I have reviewed the above documentation for accuracy and completeness, and I agree with the above. Helane Rima, DO

## 2019-08-08 LAB — CBC WITH DIFFERENTIAL/PLATELET
Basophils Absolute: 0.1 10*3/uL (ref 0.0–0.2)
Basos: 1 %
EOS (ABSOLUTE): 0.3 10*3/uL (ref 0.0–0.4)
Eos: 3 %
Hemoglobin: 11.7 g/dL (ref 11.1–15.9)
Immature Grans (Abs): 0 10*3/uL (ref 0.0–0.1)
Immature Granulocytes: 0 %
Lymphocytes Absolute: 3.1 10*3/uL (ref 0.7–3.1)
Lymphs: 36 %
MCH: 24.6 pg — ABNORMAL LOW (ref 26.6–33.0)
MCHC: 31.4 g/dL — ABNORMAL LOW (ref 31.5–35.7)
MCV: 78 fL — ABNORMAL LOW (ref 79–97)
Monocytes Absolute: 0.8 10*3/uL (ref 0.1–0.9)
Monocytes: 9 %
Neutrophils Absolute: 4.2 10*3/uL (ref 1.4–7.0)
Neutrophils: 51 %
Platelets: 362 10*3/uL (ref 150–450)
RBC: 4.76 x10E6/uL (ref 3.77–5.28)
RDW: 14.4 % (ref 11.7–15.4)
WBC: 8.4 10*3/uL (ref 3.4–10.8)

## 2019-08-08 LAB — ANEMIA PANEL
Ferritin: 29 ng/mL (ref 15–150)
Folate, Hemolysate: 312 ng/mL
Folate, RBC: 836 ng/mL (ref >498)
Hematocrit: 37.3 % (ref 34.0–46.6)
Iron Saturation: 13 % — ABNORMAL LOW (ref 15–55)
Iron: 48 ug/dL (ref 27–159)
Retic Ct Pct: 1.1 % (ref 0.6–2.6)
Total Iron Binding Capacity: 359 ug/dL (ref 250–450)
UIBC: 311 ug/dL (ref 131–425)
Vitamin B-12: 740 pg/mL (ref 232–1245)

## 2019-08-08 LAB — COMPREHENSIVE METABOLIC PANEL
ALT: 13 IU/L (ref 0–32)
AST: 10 IU/L (ref 0–40)
Albumin/Globulin Ratio: 1.2 (ref 1.2–2.2)
Albumin: 3.9 g/dL (ref 3.9–5.0)
Alkaline Phosphatase: 99 IU/L (ref 48–121)
BUN/Creatinine Ratio: 20 (ref 9–23)
BUN: 13 mg/dL (ref 6–20)
Bilirubin Total: 0.3 mg/dL (ref 0.0–1.2)
CO2: 22 mmol/L (ref 20–29)
Calcium: 9.1 mg/dL (ref 8.7–10.2)
Chloride: 102 mmol/L (ref 96–106)
Creatinine, Ser: 0.65 mg/dL (ref 0.57–1.00)
GFR calc Af Amer: 146 mL/min/{1.73_m2} (ref >59)
GFR calc non Af Amer: 126 mL/min/{1.73_m2} (ref >59)
Globulin, Total: 3.3 g/dL (ref 1.5–4.5)
Glucose: 85 mg/dL (ref 65–99)
Potassium: 4.2 mmol/L (ref 3.5–5.2)
Sodium: 137 mmol/L (ref 134–144)
Total Protein: 7.2 g/dL (ref 6.0–8.5)

## 2019-08-08 LAB — T3: T3, Total: 139 ng/dL (ref 71–180)

## 2019-08-08 LAB — VITAMIN D 25 HYDROXY (VIT D DEFICIENCY, FRACTURES): Vit D, 25-Hydroxy: 7.1 ng/mL — ABNORMAL LOW (ref 30.0–100.0)

## 2019-08-08 LAB — T4, FREE: Free T4: 1.12 ng/dL (ref 0.82–1.77)

## 2019-08-08 LAB — TSH: TSH: 1.91 u[IU]/mL (ref 0.450–4.500)

## 2019-08-16 ENCOUNTER — Encounter (INDEPENDENT_AMBULATORY_CARE_PROVIDER_SITE_OTHER): Payer: Self-pay

## 2019-08-21 ENCOUNTER — Ambulatory Visit (INDEPENDENT_AMBULATORY_CARE_PROVIDER_SITE_OTHER): Payer: BC Managed Care – PPO | Admitting: Family Medicine

## 2019-08-21 ENCOUNTER — Other Ambulatory Visit: Payer: Self-pay

## 2019-08-21 VITALS — BP 133/82 | HR 82 | Temp 98.5°F | Ht 62.0 in | Wt 317.0 lb

## 2019-08-21 DIAGNOSIS — Z9189 Other specified personal risk factors, not elsewhere classified: Secondary | ICD-10-CM | POA: Diagnosis not present

## 2019-08-21 DIAGNOSIS — F3289 Other specified depressive episodes: Secondary | ICD-10-CM | POA: Diagnosis not present

## 2019-08-21 DIAGNOSIS — E282 Polycystic ovarian syndrome: Secondary | ICD-10-CM | POA: Diagnosis not present

## 2019-08-21 DIAGNOSIS — R0683 Snoring: Secondary | ICD-10-CM

## 2019-08-21 DIAGNOSIS — E559 Vitamin D deficiency, unspecified: Secondary | ICD-10-CM | POA: Diagnosis not present

## 2019-08-21 DIAGNOSIS — Z6841 Body Mass Index (BMI) 40.0 and over, adult: Secondary | ICD-10-CM

## 2019-08-21 MED ORDER — VITAMIN D (ERGOCALCIFEROL) 1.25 MG (50000 UNIT) PO CAPS: 50000.0000 [IU] | ORAL_CAPSULE | ORAL | 0 refills | Status: DC
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
## 2019-08-22 NOTE — Progress Notes (Signed)
Chief Complaint:   OBESITY [name] is here to discuss her progress with her obesity treatment plan along with follow-up of her obesity related diagnoses. [name] is on the Category 2 Plan and states she is following her eating plan approximately 90-95% of the time. [name] states she is exercising for 0 minutes 0 times per week.  Today's visit was #: 2 Starting weight: 323 lbs Starting date: 08/07/2019 Today's weight: 317 lbs Today's date: 08/21/2019 Total lbs lost to date: 6 lbs Total lbs lost since last in-office visit: 6 lbs  Interim History: [name] says she did not eat bread, but instead used lettuce or a low carb wrap (she is used to eating low carb because her mom is diabetic).  She is getting in 4-5 bottles of water per day.  Subjective:   1. Vitamin D deficiency [name]'s Vitamin D level was 7.1 on 08/07/2019. She is currently taking prescription vitamin D 50,000 IU each week. She denies nausea, vomiting or muscle weakness.  2. PCOS (polycystic ovarian syndrome) [name] takes [employer] daily.  3. Snoring [name] has an appointment with Neurology on 08/29/2019.  4. Other depression, with emotional eating [name] is struggling with emotional eating and using food for comfort to the extent that it is negatively impacting her health. She has been working on behavior modification techniques to help reduce her emotional eating and has been unsuccessful. She shows no sign of suicidal or homicidal ideations.  Assessment/Plan:   1. Vitamin D deficiency Low Vitamin D level contributes to fatigue and are associated with obesity, breast, and colon cancer. She agrees to start to take prescription Vitamin D @50 ,000 IU every week and will follow-up for routine testing of Vitamin D, at least 2-3 times per year to avoid over-replacement.  Orders - Vitamin D, Ergocalciferol, (DRISDOL) 1.25 MG (50000 UNIT) CAPS capsule; Take 1 capsule (50,000 Units total) by mouth every 7 (seven) days.  Dispense: 4  capsule; Refill: 0  2. PCOS (polycystic ovarian syndrome) Intensive lifestyle modifications are first line treatment for this issue. We discussed several lifestyle modifications today and she will continue to work on diet, exercise and weight loss efforts. Orders and follow up as documented in patient record.  Counseling . PCOS is a leading cause of menstrual irregularities and infertility. It is also associated with obesity, hirsutism (excessive hair growth on the face, chest, or back), and cardiovascular risk factors such as high cholesterol and insulin resistance. . Insulin resistance appears to play a central role.  . Women with PCOS have been shown to have impaired appetite-regulating hormones. . Metformin is one medication that can improve metabolic parameters.  . Women with polycystic ovary syndrome (PCOS) have an increased risk for cardiovascular disease (CVD) - European Journal of Preventive Cardiology.  3. Snoring Will continue to monitor.  4. Other depression, with emotional eating Behavior modification techniques were discussed today to help [name] deal with her emotional/non-hunger eating behaviors.  Orders and follow up as documented in patient record.   5. At risk for deficient intake of food Patient was referred to Dr. Cardell Peach, our Bariatric Psychologist, for evaluation due to her elevated PHQ-9 score and significant struggles with emotional eating.  6. Class 3 severe obesity with serious comorbidity and body mass index (BMI) of 50.0 to 59.9 in adult, unspecified obesity type (HCC) [name] is currently in the action stage of change. As such, her goal is to continue with weight loss efforts. She has agreed to the Category 2 Plan.   Exercise  goals: Will try walking, steps, weights, and jump rope.  Behavioral modification strategies: increasing lean protein intake and increasing water intake.  [name] has agreed to follow-up with our clinic in 2-3 weeks. She was informed of the  importance of frequent follow-up visits to maximize her success with intensive lifestyle modifications for her multiple health conditions.   Objective:   Blood pressure 133/82, pulse 82, temperature 98.5 F (36.9 C), temperature source Oral, height 5\' 2"  (1.575 m), weight (!) 317 lb (143.8 kg), SpO2 100 %. Body mass index is 57.98 kg/m.  General: Cooperative, alert, well developed, in no acute distress. HEENT: Conjunctivae and lids unremarkable. Cardiovascular: Regular rhythm.  Lungs: Normal work of breathing. Neurologic: No focal deficits.   Lab Results  Component Value Date   CREATININE 0.65 08/07/2019   BUN 13 08/07/2019   NA 137 08/07/2019   K 4.2 08/07/2019   CL 102 08/07/2019   CO2 22 08/07/2019   Lab Results  Component Value Date   ALT 13 08/07/2019   AST 10 08/07/2019   ALKPHOS 99 08/07/2019   BILITOT 0.3 08/07/2019   Lab Results  Component Value Date   TSH 1.910 08/07/2019    Lab Results  Component Value Date   WBC 8.4 08/07/2019   HGB 11.7 08/07/2019   HCT 37.3 08/07/2019   MCV 78 (L) 08/07/2019   PLT 362 08/07/2019   Lab Results  Component Value Date   IRON 48 08/07/2019   TIBC 359 08/07/2019   FERRITIN 29 08/07/2019   Attestation Statements:   Reviewed by clinician on day of visit: allergies, medications, problem list, medical history, surgical history, family history, social history, and previous encounter notes.  I, 08/09/2019, CMA, am acting as transcriptionist for Insurance claims handler, DO  I have reviewed the above documentation for accuracy and completeness, and I agree with the above. Helane Rima, DO

## 2019-08-23 ENCOUNTER — Other Ambulatory Visit: Payer: Self-pay

## 2019-08-23 ENCOUNTER — Telehealth (INDEPENDENT_AMBULATORY_CARE_PROVIDER_SITE_OTHER): Payer: BC Managed Care – PPO | Admitting: Psychology
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
## 2019-08-26 ENCOUNTER — Encounter (INDEPENDENT_AMBULATORY_CARE_PROVIDER_SITE_OTHER): Payer: Self-pay | Admitting: Family Medicine

## 2019-08-28 ENCOUNTER — Other Ambulatory Visit (INDEPENDENT_AMBULATORY_CARE_PROVIDER_SITE_OTHER): Payer: Self-pay | Admitting: Family Medicine

## 2019-08-28 ENCOUNTER — Encounter (INDEPENDENT_AMBULATORY_CARE_PROVIDER_SITE_OTHER): Payer: Self-pay

## 2019-08-28 DIAGNOSIS — R7303 Prediabetes: Secondary | ICD-10-CM

## 2019-08-28 NOTE — Telephone Encounter (Signed)
My chart message sent to pt.

## 2019-08-29 ENCOUNTER — Ambulatory Visit (INDEPENDENT_AMBULATORY_CARE_PROVIDER_SITE_OTHER): Payer: BC Managed Care – PPO | Admitting: Neurology

## 2019-08-29 ENCOUNTER — Encounter: Payer: Self-pay | Admitting: Neurology

## 2019-08-29 ENCOUNTER — Other Ambulatory Visit: Payer: Self-pay

## 2019-08-29 VITALS — BP 132/89 | HR 84 | Ht 62.0 in | Wt 316.0 lb

## 2019-08-29 DIAGNOSIS — G479 Sleep disorder, unspecified: Secondary | ICD-10-CM

## 2019-08-29 DIAGNOSIS — R0683 Snoring: Secondary | ICD-10-CM

## 2019-08-29 DIAGNOSIS — Z820 Family history of epilepsy and other diseases of the nervous system: Secondary | ICD-10-CM | POA: Diagnosis not present

## 2019-08-29 DIAGNOSIS — G4719 Other hypersomnia: Secondary | ICD-10-CM

## 2019-08-29 DIAGNOSIS — G478 Other sleep disorders: Secondary | ICD-10-CM

## 2019-08-29 DIAGNOSIS — R0681 Apnea, not elsewhere classified: Secondary | ICD-10-CM

## 2019-08-29 DIAGNOSIS — Z6841 Body Mass Index (BMI) 40.0 and over, adult: Secondary | ICD-10-CM

## 2019-08-29 NOTE — Progress Notes (Signed)
Subjective:    Patient ID: [name] is a 24 y.o. female.  HPI     Huston FoleySaima Tristyn Demarest, MD, PhD [facility] [address], [address] [address] [place], [place] [address]  Dear Dr. Earlene PlaterWallace,   I saw your patient, Tammy FortsSahnye Schneider, upon your kind request, in my sleep clinic today for initial consultation of her sleep disorder, in particular, concern for underlying Obstructive sleep apnea. The patient is unaccompanied today. As you know, Ms. Tammy PetersMilton is a 24 year old right-handed woman with an underlying medical history of vitamin D deficiency, PCOS, seasonal allergies, lower extremity edema, back pain, joint pain, asthma, anemia, and morbid obesity with a BMI of over 50, who reports snoring and excessive daytime somnolence.  I reviewed your office note from 08/21/2019.  Her Epworth sleepiness score is 10 out of 24, fatigue severity score is 45 out of 63. She is single, lives alone, 23 year old sister is staying with her for the summer.  Patient is the oldest of 5 siblings.  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  She is a non-smoker, drinks alcohol socially, no daily caffeine currently.  Her father has sleep apnea.  Patient reports significant snoring and a family history of snoring.  She has been noted to have pauses in her breathing per sister's report.  She denies any recurrent morning headaches or night to night nocturia but does not wake up rested and often tosses and turns all night long.  She goes to bed around 1030 and rise time is around 7.  They have a ferret at the house, no cats or dogs.  She has a TV in the bedroom but does not have to watch it, puts it on a timer if it is on at night but does use her cell phone before falling asleep.  She is being evaluated for bariatric surgery, has a consultation with a surgeon pending.  Her Past Medical History Is Significant For: Past Medical History:  Diagnosis Date  . Anemia   . Asthma   . Back  pain   . Joint pain   . Lower extremity edema   . PCOS (polycystic ovarian syndrome)   . Prediabetes   . Seasonal allergies   . SOB (shortness of breath)     Her Past Surgical History Is Significant For: Past Surgical History:  Procedure Laterality Date  . FEMUR FRACTURE SURGERY  2010    Her Family History Is Significant For: Family History  Problem Relation Age of Onset  . Diabetes Mother   . Diabetes Father   . Hypertension Father   . Sleep apnea Father   . Obesity Father   . Cancer Other     Her Social History Is Significant For: Social History   Socioeconomic History  . Marital status: Single    Spouse name: Not on file  . Number of children: Not on file  . Years of education: Not on file  . Highest education level: Not on file  Occupational History  . Occupation: Geologist, engineeringteacher assistant  . Occupation: full time student  Tobacco Use  . Smoking status: Never Smoker  . Smokeless tobacco: Never Used  Substance and Sexual Activity  . Alcohol use: Yes    Comment: occ  . Drug use: No  . Sexual activity: Never  Other Topics Concern  . Not on file  Social History Narrative  . Not on file   Social Determinants of Health   Financial Resource Strain:   .  Difficulty of Paying Living Expenses:   Food Insecurity:   . Worried About Programme researcher, broadcasting/film/video in the Last Year:   . Barista in the Last Year:   Transportation Needs:   . Freight forwarder (Medical):   Marland Kitchen Lack of Transportation (Non-Medical):   Physical Activity:   . Days of Exercise per Week:   . Minutes of Exercise per Session:   Stress:   . Feeling of Stress :   Social Connections:   . Frequency of Communication with Friends and Family:   . Frequency of Social Gatherings with Friends and Family:   . Attends Religious Services:   . Active Member of Clubs or Organizations:   . Attends Banker Meetings:   Marland Kitchen Marital Status:     Her Allergies Are:  Allergies  Allergen Reactions   . Contrast Media [Iodinated Diagnostic Agents] Anaphylaxis  . Isovue [Iopamidol]     Patient began sneezing, then c/o her throat closing up, SOB, wheezing  :   Her Current Medications Are:  Outpatient Encounter Medications as of 08/29/2019  Medication Sig  . levonorgestrel-ethinyl estradiol (VIENVA) 0.1-20 MG-MCG tablet Take 1 tablet by mouth daily.  . metFORMIN (GLUCOPHAGE) 500 MG tablet Take 1 tablet (500 mg total) by mouth daily.  . Vitamin D, Ergocalciferol, (DRISDOL) 1.25 MG (50000 UNIT) CAPS capsule Take 1 capsule (50,000 Units total) by mouth every 7 (seven) days.  . [DISCONTINUED] dicyclomine (BENTYL) 20 MG tablet Take 1 tablet (20 mg total) by mouth 2 (two) times daily as needed for spasms (abdominal pain). (Patient not taking: Reported on 07/26/2016)  . [DISCONTINUED] norgestimate-ethinyl estradiol (ORTHO-CYCLEN,SPRINTEC,PREVIFEM) 0.25-35 MG-MCG tablet Take 1 tablet by mouth daily. (Patient not taking: Reported on 06/23/2016)  . [DISCONTINUED] promethazine (PHENERGAN) 25 MG tablet Take 1 tablet (25 mg total) by mouth every 8 (eight) hours as needed for nausea or vomiting. (Patient not taking: Reported on 03/23/2017)   No facility-administered encounter medications on file as of 08/29/2019.  :  Review of Systems:  Out of a complete 14 point review of systems, all are reviewed and negative with the exception of these symptoms as listed below: Review of Systems  Neurological:       Pt here because she is working up to have bariatric surgery and they are sending her to work up for sleep apnea. She has never had a SS. Admits to snoring and waking up feeling tired despite sleeping through the night. Epworth Sleepiness Scale 0= would never doze 1= slight chance of dozing 2= moderate chance of dozing 3= high chance of dozing  Sitting and reading:2 Watching TV:2 Sitting inactive in a public place (ex. Theater or meeting):0 As a passenger in a car for an hour without a break:3 Lying down  to rest in the afternoon:3 Sitting and talking to someone:0 Sitting quietly after lunch (no alcohol):0 In a car, while stopped in traffic:0 Total:10     Objective:  Neurological Exam  Physical Exam Physical Examination:   Vitals:   08/29/19 0739  BP: 132/89  Pulse: 84    General Examination: The patient is a very pleasant 24 y.o. female in no acute distress. She appears well-developed and well-nourished and well groomed.   HEENT: Normocephalic, atraumatic, pupils are equal, round and reactive to light, extraocular tracking is good without limitation to gaze excursion or nystagmus noted. Hearing is grossly intact. Face is symmetric with normal facial animation. Speech is clear with no dysarthria noted. There is no hypophonia.  There is no lip, neck/head, jaw or voice tremor. Neck is supple with full range of passive and active motion. There are no carotid bruits on auscultation. Oropharynx exam reveals: mild mouth dryness, good dental hygiene with braces in place, moderate airway crowding noted secondary to tonsillar size of 2-3+, slightly prominent uvula, normal-appearing tongue, tongue protrudes centrally in palate elevates symmetrically, Mallampati class II, neck circumference of 16 and three-quarter inches, she has a minimal overbite.    Chest: Clear to auscultation without wheezing, rhonchi or crackles noted.  Heart: S1+S2+0, regular and normal without murmurs, rubs or gallops noted.   Abdomen: Soft, non-tender and non-distended with normal bowel sounds appreciated on auscultation.  Extremities: There is no pitting edema in the distal lower extremities bilaterally.   Skin: Warm and dry without trophic changes noted.   Musculoskeletal: exam reveals no obvious joint deformities, tenderness or joint swelling or erythema.   Neurologically:  Mental status: The patient is awake, alert and oriented in all 4 spheres. Her immediate and remote memory, attention, language skills and fund  of knowledge are appropriate. There is no evidence of aphasia, agnosia, apraxia or anomia. Speech is clear with normal prosody and enunciation. Thought process is linear. Mood is normal and affect is normal.  Cranial nerves II - XII are as described above under HEENT exam.  Motor exam: Normal bulk, strength and tone is noted. There is no tremor, Romberg is negative. Fine motor skills and coordination: grossly intact.  Cerebellar testing: No dysmetria or intention tremor. There is no truncal or gait ataxia.  Sensory exam: intact to light touch in the upper and lower extremities.  Gait, station and balance: She stands easily. No veering to one side is noted. No leaning to one side is noted. Posture is age-appropriate and stance is narrow based. Gait shows normal stride length and normal pace. No problems turning are noted. Tandem walk is unremarkable.                Assessment and Plan:   In summary, HEDDY VIDANA is a very pleasant 24 y.o.-year old female with an underlying medical history of vitamin D deficiency, PCOS, seasonal allergies, lower extremity edema, back pain, joint pain, asthma, anemia, and morbid obesity with a BMI of over 50, whose history and physical exam are concerning for obstructive sleep apnea (OSA). I had a long chat with the patient about my findings and the diagnosis of OSA, its prognosis and treatment options. We talked about medical treatments, surgical interventions and non-pharmacological approaches. I explained in particular the risks and ramifications of untreated moderate to severe OSA, especially with respect to developing cardiovascular disease down the Road, including congestive heart failure, difficult to treat hypertension, cardiac arrhythmias, or stroke. Even type 2 diabetes has, in part, been linked to untreated OSA. Symptoms of untreated OSA include daytime sleepiness, memory problems, mood irritability and mood disorder such as depression and anxiety, lack of  energy, as well as recurrent headaches, especially morning headaches. We talked about trying to maintain a healthy lifestyle in general, as well as the importance of weight control. We also talked about the importance of good sleep hygiene. I recommended the following at this time: sleep study.  I explained the sleep test procedure to the patient and also outlined possible surgical and non-surgical treatment options of OSA, including the use of a custom-made dental device (which would require a referral to a specialist dentist or oral surgeon, but she currently has braces), upper airway surgical options (  which would involve a referral to an ENT surgeon, but she is pending evaluation for bariatric surgery). I also explained the CPAP treatment option to the patient, who indicated that she would be willing to try CPAP if the need arises. I explained the importance of being compliant with PAP treatment, not only for insurance purposes but primarily to improve Her symptoms, and for the patient's long term health benefit, including to reduce Her cardiovascular risks. I answered all her questions today and the patient was in agreement. I plan to see her back after the sleep study is completed and encouraged her to call with any interim questions, concerns, problems or updates.   Thank you very much for allowing me to participate in the care of this nice patient. If I can be of any further assistance to you please do not hesitate to call me at [phone].  Sincerely,   Huston Foley, MD, PhD

## 2019-08-29 NOTE — Patient Instructions (Signed)

## 2019-09-03 ENCOUNTER — Other Ambulatory Visit (INDEPENDENT_AMBULATORY_CARE_PROVIDER_SITE_OTHER): Payer: Self-pay | Admitting: Family Medicine

## 2019-09-03 ENCOUNTER — Encounter (INDEPENDENT_AMBULATORY_CARE_PROVIDER_SITE_OTHER): Payer: Self-pay | Admitting: Family Medicine

## 2019-09-03 DIAGNOSIS — E282 Polycystic ovarian syndrome: Secondary | ICD-10-CM

## 2019-09-03 DIAGNOSIS — F3289 Other specified depressive episodes: Secondary | ICD-10-CM

## 2019-09-03 DIAGNOSIS — Z6841 Body Mass Index (BMI) 40.0 and over, adult: Secondary | ICD-10-CM

## 2019-09-03 DIAGNOSIS — R7303 Prediabetes: Secondary | ICD-10-CM

## 2019-09-03 DIAGNOSIS — E559 Vitamin D deficiency, unspecified: Secondary | ICD-10-CM

## 2019-09-04 DIAGNOSIS — F32A Depression, unspecified: Secondary | ICD-10-CM | POA: Insufficient documentation

## 2019-09-04 DIAGNOSIS — R7303 Prediabetes: Secondary | ICD-10-CM | POA: Insufficient documentation

## 2019-09-04 DIAGNOSIS — E282 Polycystic ovarian syndrome: Secondary | ICD-10-CM | POA: Insufficient documentation

## 2019-09-04 DIAGNOSIS — D649 Anemia, unspecified: Secondary | ICD-10-CM | POA: Insufficient documentation

## 2019-09-04 DIAGNOSIS — E559 Vitamin D deficiency, unspecified: Secondary | ICD-10-CM | POA: Insufficient documentation

## 2019-09-04 NOTE — Telephone Encounter (Signed)
Please advise 

## 2019-09-04 NOTE — Telephone Encounter (Signed)
Please see

## 2019-09-05 ENCOUNTER — Other Ambulatory Visit: Payer: Self-pay

## 2019-09-05 ENCOUNTER — Telehealth (INDEPENDENT_AMBULATORY_CARE_PROVIDER_SITE_OTHER): Payer: BC Managed Care – PPO | Admitting: Psychology

## 2019-09-05 DIAGNOSIS — F5089 Other specified eating disorder: Secondary | ICD-10-CM | POA: Diagnosis not present

## 2019-09-05 MED ORDER — METFORMIN HCL 500 MG PO TABS: 500.0000 mg | ORAL_TABLET | Freq: Every day | ORAL | 0 refills | Status: DC

## 2019-09-10 ENCOUNTER — Telehealth: Payer: Self-pay

## 2019-09-10 NOTE — Telephone Encounter (Signed)
LVM for pt to call me back to schedule sleep study  

## 2019-09-11 ENCOUNTER — Ambulatory Visit (INDEPENDENT_AMBULATORY_CARE_PROVIDER_SITE_OTHER): Payer: BC Managed Care – PPO | Admitting: Family Medicine

## 2019-09-11 DIAGNOSIS — E282 Polycystic ovarian syndrome: Secondary | ICD-10-CM | POA: Diagnosis not present

## 2019-09-11 DIAGNOSIS — E559 Vitamin D deficiency, unspecified: Secondary | ICD-10-CM | POA: Diagnosis not present

## 2019-09-11 DIAGNOSIS — R7303 Prediabetes: Secondary | ICD-10-CM | POA: Diagnosis not present

## 2019-09-13 ENCOUNTER — Other Ambulatory Visit: Payer: Self-pay | Admitting: General Surgery

## 2019-09-13 ENCOUNTER — Other Ambulatory Visit (HOSPITAL_COMMUNITY): Payer: Self-pay | Admitting: General Surgery

## 2019-09-17 ENCOUNTER — Encounter (INDEPENDENT_AMBULATORY_CARE_PROVIDER_SITE_OTHER): Payer: Self-pay | Admitting: Family Medicine

## 2019-09-17 ENCOUNTER — Ambulatory Visit (INDEPENDENT_AMBULATORY_CARE_PROVIDER_SITE_OTHER): Payer: BC Managed Care – PPO | Admitting: Family Medicine

## 2019-09-17 ENCOUNTER — Other Ambulatory Visit: Payer: Self-pay

## 2019-09-17 VITALS — BP 128/81 | HR 82 | Temp 97.9°F | Ht 62.0 in | Wt 306.0 lb

## 2019-09-17 DIAGNOSIS — R7303 Prediabetes: Secondary | ICD-10-CM

## 2019-09-17 DIAGNOSIS — Z6841 Body Mass Index (BMI) 40.0 and over, adult: Secondary | ICD-10-CM

## 2019-09-17 DIAGNOSIS — F3289 Other specified depressive episodes: Secondary | ICD-10-CM | POA: Diagnosis not present

## 2019-09-18 ENCOUNTER — Encounter (INDEPENDENT_AMBULATORY_CARE_PROVIDER_SITE_OTHER): Payer: Self-pay | Admitting: Family Medicine

## 2019-09-18 NOTE — Progress Notes (Signed)
Chief Complaint:   OBESITY [name] is here to discuss her progress with her obesity treatment plan along with follow-up of her obesity related diagnoses. [name] is on the Category 2 Plan and states she is following her eating plan approximately 100% of the time. [name] states she is walking, and workout videos for 30 minutes 5 times per week.  Today's visit was #: 3 Starting weight: 323 lbs Starting date: 08/07/2019 Today's weight: 306 lbs Today's date: 09/17/2019 Total lbs lost to date: 17 Total lbs lost since last in-office visit: 11  Interim History: [name] is preparing for bariatric surgery. She met with Dr. Redmond Pulling last week. She is planning to have a sleeve gastrectomy. She is adhering to the plan very well and eating all of the protein on the plan.  Subjective:   1. Pre-diabetes [name] has a diagnosis of pre-diabetes based on her elevated Hgb A1c and was informed this puts her at greater risk of developing diabetes. Last A1c was 5.7 in May 2021. She is on metformin  daily, and she is tolerating it well. She denies polyphagia. She continues to work on diet and exercise to decrease her risk of diabetes.   No results found for: HGBA1C No results found for: INSULIN  2. Other depression, with emotional eating [name] denies stress eating at this point. She is seeing Dr. Mallie Mussel. She has a history of binge type eating (per patient).  Assessment/Plan:   1. Pre-diabetes [name] will continue metformin, and will continue to work on weight loss, exercise, and decreasing simple carbohydrates to help decrease the risk of diabetes.   2. Other depression, with emotional eating Behavior modification techniques were discussed today to help [name] deal with her emotional/non-hunger eating behaviors. She will continue to follow up with Dr. Mallie Mussel. Orders and follow up as documented in patient record.   3. Class 3 severe obesity with serious comorbidity and body mass index (BMI) of 50.0 to 59.9  in adult, unspecified obesity type (HCC) [name] is currently in the action stage of change. As such, her goal is to continue with weight loss efforts. She has agreed to the Category 2 Plan.    Patient was advised that bariatric surgery is a tool for weight loss and will not be successful long-term without significant dietary changes and exercise.  Handout given today: Lunch Options.  Exercise goals: As is.  Behavioral modification strategies: meal planning and cooking strategies and planning for success.  [name] has agreed to follow-up with our clinic in 2 weeks. She was informed of the importance of frequent follow-up visits to maximize her success with intensive lifestyle modifications for her multiple health conditions.   Objective:   Blood pressure 128/81, pulse 82, temperature 97.9 F (36.6 C), temperature source Oral, height _0  (1.575 m), weight (!) 306 lb (138.8 kg), last menstrual period 09/17/2019, SpO2 100 %. Body mass index is 55.97 kg/m.  General: Cooperative, alert, well developed, in no acute distress. HEENT: Conjunctivae and lids unremarkable. Cardiovascular: Regular rhythm.  Lungs: Normal work of breathing. Neurologic: No focal deficits.   Lab Results  Component Value Date   CREATININE 0.65 08/07/2019   BUN 13 08/07/2019   NA 137 08/07/2019   K 4.2 08/07/2019   CL 102 08/07/2019   CO2 22 08/07/2019   Lab Results  Component Value Date   ALT 13 08/07/2019   AST 10 08/07/2019   ALKPHOS 99 08/07/2019   BILITOT 0.3 08/07/2019   No results found for: HGBA1C No  results found for: INSULIN Lab Results  Component Value Date   TSH 1.910 08/07/2019   No results found for: CHOL, HDL, LDLCALC, LDLDIRECT, TRIG, CHOLHDL Lab Results  Component Value Date   WBC 8.4 08/07/2019   HGB 11.7 08/07/2019   HCT 37.3 08/07/2019   MCV 78 (L) 08/07/2019   PLT 362 08/07/2019   Lab Results  Component Value Date   IRON 48 08/07/2019   TIBC 359 08/07/2019   FERRITIN 29  08/07/2019   Attestation Statements:   Reviewed by clinician on day of visit: allergies, medications, problem list, medical history, surgical history, family history, social history, and previous encounter notes.   Wilhemena Durie, am acting as Location manager for [employer], FNP-C.  I have reviewed the above documentation for accuracy and completeness, and I agree with the above. -  Georgianne Fick, FNP

## 2019-09-21 ENCOUNTER — Ambulatory Visit (HOSPITAL_COMMUNITY)
Admission: RE | Admit: 2019-09-21 | Discharge: 2019-09-21 | Disposition: A | Discharge status: Home / Self Care | Payer: BC Managed Care – PPO | Source: Ambulatory Visit | Attending: General Surgery | Admitting: General Surgery

## 2019-09-21 ENCOUNTER — Other Ambulatory Visit: Payer: Self-pay

## 2019-09-21 DIAGNOSIS — J45909 Unspecified asthma, uncomplicated: Secondary | ICD-10-CM | POA: Diagnosis not present

## 2019-09-21 DIAGNOSIS — Z9884 Bariatric surgery status: Secondary | ICD-10-CM | POA: Diagnosis not present

## 2019-09-21 DIAGNOSIS — Z01818 Encounter for other preprocedural examination: Secondary | ICD-10-CM | POA: Diagnosis not present

## 2019-09-23 ENCOUNTER — Other Ambulatory Visit (INDEPENDENT_AMBULATORY_CARE_PROVIDER_SITE_OTHER): Payer: Self-pay | Admitting: Family Medicine

## 2019-09-23 DIAGNOSIS — E559 Vitamin D deficiency, unspecified: Secondary | ICD-10-CM

## 2019-09-25 ENCOUNTER — Other Ambulatory Visit: Payer: Self-pay

## 2019-09-25 ENCOUNTER — Other Ambulatory Visit: Payer: BC Managed Care – PPO

## 2019-09-25 DIAGNOSIS — Z20822 Contact with and (suspected) exposure to covid-19: Secondary | ICD-10-CM

## 2019-09-26 LAB — SARS-COV-2, NAA 2 DAY TAT

## 2019-09-26 LAB — NOVEL CORONAVIRUS, NAA: SARS-CoV-2, NAA: NOT DETECTED

## 2019-09-29 ENCOUNTER — Other Ambulatory Visit (INDEPENDENT_AMBULATORY_CARE_PROVIDER_SITE_OTHER): Payer: Self-pay | Admitting: Family Medicine

## 2019-09-29 DIAGNOSIS — R7303 Prediabetes: Secondary | ICD-10-CM

## 2019-10-01 ENCOUNTER — Ambulatory Visit (INDEPENDENT_AMBULATORY_CARE_PROVIDER_SITE_OTHER): Payer: BC Managed Care – PPO | Admitting: Neurology

## 2019-10-01 ENCOUNTER — Other Ambulatory Visit (INDEPENDENT_AMBULATORY_CARE_PROVIDER_SITE_OTHER): Payer: Self-pay | Admitting: Family Medicine

## 2019-10-01 DIAGNOSIS — G4719 Other hypersomnia: Secondary | ICD-10-CM

## 2019-10-01 DIAGNOSIS — G479 Sleep disorder, unspecified: Secondary | ICD-10-CM

## 2019-10-01 DIAGNOSIS — G4733 Obstructive sleep apnea (adult) (pediatric): Secondary | ICD-10-CM | POA: Diagnosis not present

## 2019-10-01 DIAGNOSIS — E559 Vitamin D deficiency, unspecified: Secondary | ICD-10-CM

## 2019-10-01 DIAGNOSIS — G478 Other sleep disorders: Secondary | ICD-10-CM

## 2019-10-01 DIAGNOSIS — R0683 Snoring: Secondary | ICD-10-CM

## 2019-10-01 DIAGNOSIS — Z820 Family history of epilepsy and other diseases of the nervous system: Secondary | ICD-10-CM

## 2019-10-01 DIAGNOSIS — R0681 Apnea, not elsewhere classified: Secondary | ICD-10-CM

## 2019-10-04 ENCOUNTER — Ambulatory Visit (INDEPENDENT_AMBULATORY_CARE_PROVIDER_SITE_OTHER): Payer: BC Managed Care – PPO | Admitting: Family Medicine

## 2019-10-05 NOTE — Progress Notes (Signed)
Patient referred by Dr. Earlene Plater, seen by me on 08/29/19, HST on 10/01/19.    Please call and notify the patient that the recent home sleep test showed obstructive sleep apnea. OSA is overall mild, but worth treating to see if she feels better after treatment, and it may help with her weight loss endeavors too. To that end I recommend treatment in the form of autoPAP, which means, that we don't have to bring her in for a sleep study with CPAP, but will let her try an autoPAP machine at home, through a DME company (of her choice, or as per insurance requirement). The DME representative will educate her on how to use the machine, how to put the mask on, etc. I have placed an order in the chart. Please send referral, talk to patient, send report to referring MD. We will need a FU in sleep clinic for 10 weeks post-PAP set up, please arrange that with me or one of our NPs. Thanks,   Huston Foley, MD, PhD [facility])

## 2019-10-05 NOTE — Addendum Note (Signed)
Addended by: Huston Foley on: 10/05/2019 01:54 PM   Modules accepted: Orders

## 2019-10-05 NOTE — Procedures (Signed)
Patient Information     First Name: Tammy Last Name: Schneider ID: [id number]  Birth Date: 02-14-1996 Age: 24 Gender: Female  Referring Provider: Helane Rima, DO BMI: 58.0 (W=315 lb, H=5' 2'')  Neck Circ.:  17 '' Epworth:  10/24   Sleep Study Information    Study Date: 10/01/19 S/H/A Version: [id number] / 4.1.1528 / 54  History:    24 year old woman with a history of vitamin D deficiency, PCOS, seasonal allergies, lower extremity edema, back pain, joint pain, asthma, anemia, and morbid obesity with a BMI of over 50, who reports snoring and excessive daytime somnolence. Summary & Diagnosis:     Mild OSA Recommendations:     This home sleep test demonstrates overall mild obstructive sleep apnea with a total AHI of 7.5/hour and O2 nadir of 88%. Mild to moderate snoring was noted. Given the patient's medical history and sleep related complaints, treatment with positive airway pressure is a reasonable choice and will be offered to the patient. treatment can be achieved in the form of autoPAP trial/titration at home. A full night CPAP titration study can help with proper treatment settings and mask fitting, if needed, in the future. Alternative treatments include weight loss along with avoidance of the supine sleep position, or an oral appliance in appropriate candidates.   Please note that untreated obstructive sleep apnea may carry additional perioperative morbidity. Patients with significant obstructive sleep apnea should receive perioperative PAP therapy and the surgeons and particularly the anesthesiologist should be informed of the diagnosis and the severity of the sleep disordered breathing. The patient should be cautioned not to drive, work at heights, or operate dangerous or heavy equipment when tired or sleepy. Review and reiteration of good sleep hygiene measures should be pursued with any patient. Other causes of the patient's symptoms, including circadian rhythm disturbances, an underlying mood  disorder, medication effect and/or an underlying medical problem cannot be ruled out based on this test. Clinical correlation is recommended.   The patient and her referring provider will be notified of the test results. The patient will be seen in follow up in sleep clinic at [facility].  I certify that I have reviewed the raw data recording prior to the issuance of this report in accordance with the standards of the American Academy of Sleep Medicine (AASM).  Huston Foley, MD, PhD [facility]) Diplomat, ABPN (Neurology and Sleep)          Sleep Summary  Oxygen Saturation Statistics   Start Study Time: End Study Time: Total Recording Time:          10:33:43 PM 7:03:16 AM   8 h, 29 min  Total Sleep Time % REM of Sleep Time:  7 h, 30 min  27.0    Mean: 94 Minimum: 88 Maximum: 98  Mean of Desaturations Nadirs (%):   91  Oxygen Desaturation. %: 4-9 10-20 >20 Total  Events Number Total  24 100.0  0 0.0  0 0.0  24 100.0  Oxygen Saturation: <90 <=88 <85 <80 <70  Duration (minutes): Sleep % 0.2 0.0 0.0 0.0 0.0 0.0 0.0 0.0 0.0 0.0     Respiratory Indices      Total Events REM NREM All Night  pRDI:  59  pAHI:  52 ODI:  24  pAHIc:  0  % CSR: 0.0 22.3 21.6 12.7 0.0 4.5 3.3 0.7 0.0 8.5 7.5 3.5 0.0       Pulse Rate Statistics during Sleep (BPM)  Mean: 79 Minimum: 49 Maximum: 111    Indices are calculated using technically valid sleep time of 6 h, 57 min. Central-Indices are calculated using technically valid sleep time of 5 h, 15 min. pRDI/pAHI are calculated using oxi desaturations ? 3%  Body Position Statistics  Position Supine Prone Right Left Non-Supine  Sleep (min) 222.0 90.5 69.5 31.0 191.0  Sleep % 49.3 20.1 15.4 6.9 42.4  pRDI 9.1 5.9 3.5 32.5 8.9  pAHI 8.3 5.1 1.8 30.1 7.5  ODI 4.4 0.7 0.0 16.2 2.7     Snoring Statistics Snoring Level (dB) >40 >50 >60 >70 >80 >Threshold (45)  Sleep (min) 313.2 60.1 4.2 0.0 0.0 154.5    Sleep % 69.5 13.3 0.9 0.0 0.0 34.3    Mean: 44 dB Sleep Stages Chart

## 2019-10-08 ENCOUNTER — Other Ambulatory Visit (INDEPENDENT_AMBULATORY_CARE_PROVIDER_SITE_OTHER): Payer: Self-pay | Admitting: Family Medicine

## 2019-10-08 DIAGNOSIS — E559 Vitamin D deficiency, unspecified: Secondary | ICD-10-CM

## 2019-10-08 DIAGNOSIS — R7303 Prediabetes: Secondary | ICD-10-CM

## 2019-10-08 MED ORDER — METFORMIN HCL 500 MG PO TABS: 500.0000 mg | ORAL_TABLET | Freq: Every day | ORAL | 0 refills | Status: DC

## 2019-10-08 MED ORDER — VITAMIN D (ERGOCALCIFEROL) 1.25 MG (50000 UNIT) PO CAPS: 50000.0000 [IU] | ORAL_CAPSULE | ORAL | 0 refills | Status: DC

## 2019-10-11 ENCOUNTER — Encounter (INDEPENDENT_AMBULATORY_CARE_PROVIDER_SITE_OTHER): Payer: Self-pay | Admitting: Family Medicine

## 2019-10-11 NOTE — Telephone Encounter (Signed)
Please advise 

## 2019-10-19 DIAGNOSIS — R030 Elevated blood-pressure reading, without diagnosis of hypertension: Secondary | ICD-10-CM | POA: Diagnosis not present

## 2019-10-20 DIAGNOSIS — F509 Eating disorder, unspecified: Secondary | ICD-10-CM | POA: Diagnosis not present

## 2019-10-23 ENCOUNTER — Encounter (INDEPENDENT_AMBULATORY_CARE_PROVIDER_SITE_OTHER): Payer: Self-pay | Admitting: Family Medicine

## 2019-10-23 ENCOUNTER — Ambulatory Visit (INDEPENDENT_AMBULATORY_CARE_PROVIDER_SITE_OTHER): Payer: BC Managed Care – PPO | Admitting: Family Medicine

## 2019-10-23 ENCOUNTER — Other Ambulatory Visit: Payer: Self-pay

## 2019-10-23 VITALS — BP 135/81 | HR 92 | Temp 98.3°F | Ht 62.0 in | Wt 299.0 lb

## 2019-10-23 DIAGNOSIS — R7303 Prediabetes: Secondary | ICD-10-CM

## 2019-10-23 DIAGNOSIS — E559 Vitamin D deficiency, unspecified: Secondary | ICD-10-CM | POA: Diagnosis not present

## 2019-10-23 DIAGNOSIS — D508 Other iron deficiency anemias: Secondary | ICD-10-CM | POA: Diagnosis not present

## 2019-10-23 DIAGNOSIS — Z9189 Other specified personal risk factors, not elsewhere classified: Secondary | ICD-10-CM

## 2019-10-23 DIAGNOSIS — Z6841 Body Mass Index (BMI) 40.0 and over, adult: Secondary | ICD-10-CM

## 2019-10-24 ENCOUNTER — Other Ambulatory Visit: Payer: Self-pay

## 2019-10-24 ENCOUNTER — Encounter: Payer: BC Managed Care – PPO | Attending: General Surgery | Admitting: Dietician

## 2019-10-24 ENCOUNTER — Encounter: Payer: Self-pay | Admitting: Dietician

## 2019-10-24 DIAGNOSIS — E669 Obesity, unspecified: Secondary | ICD-10-CM | POA: Diagnosis not present

## 2019-10-24 NOTE — Progress Notes (Signed)
Nutrition Assessment for Bariatric Surgery Medical Nutrition Therapy   Patient was seen on 10/24/2019 for Pre-Operative Nutrition Assessment. Letter of approval faxed to [facility] bariatric surgery program coordinator on 10/24/2019.   Referral stated Supervised Weight Loss (SWL) visits needed: 0  Planned surgery: Sleeve  Pt expectation of surgery: to be more comfortable and physically active   NUTRITION ASSESSMENT   Anthropometrics  Start weight at [facility]: 303.8 lbs (date: 10/24/2019) Height: 62 in BMI: 55.6 kg/m2     Lifestyle & Dietary Hx Patient has family friends who have had bariatric surgery before. Currently in school and works as a Midwife. Typical meal pattern is 3 meals plus 2 snacks per day. Drinks 6-7 bottles of water per day. Lives alone so she prepares her meals/snacks and does not eat out very often.    Any previous deficiencies: iron (anemia)  Micronutrient Nutrition Focused Physical Exam: Hair: no issues observed Eyes: no issues observed Mouth: no issues observed Neck: no issues observed Nails: no issues observed Skin: no issues observed  24-Hr Dietary Recall First Meal: 2 eggs + [place] sausage + fruit Snack: granola bar (or fruit) (or yogurt)  Second Meal: salad w/ cheese & [place] Snack: popcorn  Third Meal: meat + vegetables  Snack: - Beverages: water, Crystal Light    NUTRITION DIAGNOSIS  Overweight/obesity ([place]-3.3) related to past poor dietary habits and physical inactivity as evidenced by patient w/ planned Sleeve Gastrectomy surgery following dietary guidelines for continued weight loss.    NUTRITION INTERVENTION  Nutrition counseling (C-1) and education (E-2) to facilitate bariatric surgery goals.  Pre-Op Goals Reviewed with the Patient . Track food and beverage intake (pen and paper, MyFitness Pal, Baritastic app, etc.) . Make healthy food choices while monitoring portion sizes . Consume 3 meals per day or try to eat  every 3-5 hours . Avoid concentrated sugars and fried foods . Keep sugar & fat in the single digits per serving on food labels . Practice CHEWING your food (aim for applesauce consistency) . Practice not drinking 15 minutes before, during, and 30 minutes after each meal and snack . Avoid all carbonated beverages (ex: soda, sparkling beverages)  . Limit caffeinated beverages (ex: coffee, tea, energy drinks) . Avoid all sugar-sweetened beverages (ex: regular soda, sports drinks)  . Avoid alcohol  . Aim for 64-100 ounces of FLUID daily (with at least half of fluid intake being plain water)  . Aim for at least 60-80 grams of PROTEIN daily . Look for a liquid protein source that contains ?15 g protein and ?5 g carbohydrate (ex: shakes, drinks, shots) . Make a list of non-food related activities . Physical activity is an important part of a healthy lifestyle so keep it moving! The goal is to reach 150 minutes of exercise per week, including cardiovascular and weight baring activity.  Handouts Provided Include  . Bariatric Surgery handouts (Nutrition Visits, Pre-Op Goals, Protein Shakes, Vitamins & Minerals)  Learning Style & Readiness for Change Teaching method utilized: Visual & Auditory  Demonstrated degree of understanding via: Teach Back  Barriers to learning/adherence to lifestyle change: None Identified    MONITORING & EVALUATION Dietary intake, weekly physical activity, body weight, and pre-op goals reached at next nutrition visit.   Next Steps Patient is to follow up at [facility] for Pre-Op Class (>2 weeks before surgery) for further nutrition education.

## 2019-10-24 NOTE — Patient Instructions (Signed)
Begin working through the [employer] discussed today, starting with 1-2 you think may be most challenging.   See you at Pre-Op Class!

## 2019-10-24 NOTE — Progress Notes (Signed)
Chief Complaint:   OBESITY [name] is here to discuss her progress with her obesity treatment plan along with follow-up of her obesity related diagnoses. [name] is on the Category 2 Plan and states she is following her eating plan approximately 90% of the time. [name] states she is walking 2.4 miles and workout with Youtube 4-5 times per week.  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 She has been following the meal plan almost completely and has noticed hunger. She is close to being completed with pre-op process for bariatric surgery.  Subjective:   1. Other iron deficiency anemia [name] last MCV was 78, and she is on iron supplement and vitamin C supplement. Her last iron level was within normal limits, and ferritin within normal limits.  2. Vitamin D deficiency [name] denies nausea, vomiting, or muscle weakness, but she notes fatigue. She is on prescription Vit D, and last Vit D level was 7.1 on 08/07/2019.  3. Pre-diabetes [name] is on metformin with no GI side effects. Last A1c was 5.7 prior to her visit on 08/07/2019.  4. At risk of diabetes mellitus [name] is at higher than average risk for developing diabetes due to her obesity.   Assessment/Plan:   1. Other iron deficiency anemia We will repeat labs in 1 month, and [name] will continue to follow up as directed. Orders and follow up as documented in patient record.  Counseling . Iron is essential for our bodies to make red blood cells.  Reasons that someone may be deficient include: an iron-deficient diet (more likely in those following vegan or vegetarian diets), women with heavy menses, patients with GI disorders or poor absorption, patients that have had  bariatric surgery, frequent blood donors, patients with cancer, and patients with heart disease.   [place] foods include dark leafy greens, red and white meats, eggs, seafood, and beans.   . Certain foods and drinks prevent your body from absorbing iron properly. Avoid eating these foods in the same meal as iron-rich foods or with iron supplements. These foods include: coffee, black tea, and red wine; milk, dairy products, and foods that are high in calcium; beans and soybeans; whole grains.  . Constipation can be a side effect of iron supplementation. Increased water and fiber intake are helpful. Water goal: > 2 liters/day. Fiber goal: > 25 grams/day.  2. Vitamin D deficiency Low Vitamin D level contributes to fatigue and are associated with obesity, breast, and colon cancer. [name] agreed to continue taking prescription Vitamin D 50,000 IU every week, no refill needed. She will follow-up for routine testing of Vitamin D, at least 2-3 times per year to avoid over-replacement.  3. Pre-diabetes [name] will continue to work on weight loss, exercise, and decreasing simple carbohydrates to help decrease the risk of diabetes. We will refill metformin 500 mg PO daily #30 for 1 month.  4. At risk of diabetes mellitus [name] was given approximately 15 minutes of diabetes education and counseling today. We discussed intensive lifestyle modifications today with an emphasis on weight loss as well as increasing exercise and decreasing simple carbohydrates in her diet. We also reviewed medication options with an emphasis on risk versus benefit of those discussed.   Repetitive  spaced learning was employed today to elicit superior memory formation and behavioral change.  5. Class 3 severe obesity with serious comorbidity and body mass index (BMI) of 50.0 to 59.9 in adult, unspecified obesity type (HCC) [name] is currently in the action stage of change. As such, her goal is to continue with weight loss efforts.  She has agreed to the Category 3 Plan.   Exercise goals: As is.  Behavioral modification strategies: increasing lean protein intake, meal planning and cooking strategies, keeping healthy foods in the home and planning for success.  [name] has agreed to follow-up with our clinic in 2 weeks. She was informed of the importance of frequent follow-up visits to maximize her success with intensive lifestyle modifications for her multiple health conditions.   Objective:   Blood pressure 135/81, pulse 92, temperature 98.3 F (36.8 C), temperature source Oral, height 5\' 2"  (1.575 m), weight 299 lb (135.6 kg), last menstrual period 10/09/2019, SpO2 100 %. Body mass index is 54.69 kg/m.  General: Cooperative, alert, well developed, in no acute distress. HEENT: Conjunctivae and lids unremarkable. Cardiovascular: Regular rhythm.  Lungs: Normal work of breathing. Neurologic: No focal deficits.   Lab Results  Component Value Date   CREATININE 0.65 08/07/2019   BUN 13 08/07/2019   NA 137 08/07/2019   K 4.2 08/07/2019   CL 102 08/07/2019   CO2 22 08/07/2019   Lab Results  Component Value Date   ALT 13 08/07/2019   AST 10 08/07/2019   ALKPHOS 99 08/07/2019   BILITOT 0.3 08/07/2019   No results found for: HGBA1C No results found for: INSULIN Lab Results  Component Value Date   TSH 1.910 08/07/2019   No results found for: CHOL, HDL, LDLCALC, LDLDIRECT, TRIG, CHOLHDL Lab Results  Component Value Date   WBC 8.4 08/07/2019   HGB 11.7 08/07/2019   HCT 37.3 08/07/2019   MCV 78 (L) 08/07/2019   PLT 362 08/07/2019   Lab Results  Component Value Date   IRON 48 08/07/2019   TIBC 359 08/07/2019   FERRITIN 29 08/07/2019   Attestation Statements:   Reviewed by clinician on day of visit: allergies, medications, problem list, medical history, surgical history, family history, social history, and previous encounter notes.  Time spent on visit including pre-visit chart review and post-visit  care and charting was 15 minutes.    I, 08/09/2019, am acting as transcriptionist for Burt Knack, MD.  I have reviewed the above documentation for accuracy and completeness, and I agree with the above. - Reuben Likes, MD

## 2019-10-29 ENCOUNTER — Encounter (INDEPENDENT_AMBULATORY_CARE_PROVIDER_SITE_OTHER): Payer: Self-pay

## 2019-10-29 ENCOUNTER — Other Ambulatory Visit (INDEPENDENT_AMBULATORY_CARE_PROVIDER_SITE_OTHER): Payer: Self-pay | Admitting: Family Medicine

## 2019-10-29 DIAGNOSIS — E559 Vitamin D deficiency, unspecified: Secondary | ICD-10-CM

## 2019-10-29 DIAGNOSIS — R7303 Prediabetes: Secondary | ICD-10-CM

## 2019-10-29 NOTE — Telephone Encounter (Signed)
Message sent to the pt-CS

## 2019-11-06 ENCOUNTER — Ambulatory Visit (INDEPENDENT_AMBULATORY_CARE_PROVIDER_SITE_OTHER): Payer: BC Managed Care – PPO | Admitting: Family Medicine

## 2019-11-06 ENCOUNTER — Encounter (INDEPENDENT_AMBULATORY_CARE_PROVIDER_SITE_OTHER): Payer: Self-pay | Admitting: Family Medicine

## 2019-11-06 ENCOUNTER — Other Ambulatory Visit: Payer: Self-pay

## 2019-11-06 VITALS — BP 118/81 | HR 86 | Temp 98.4°F | Ht 62.0 in | Wt 293.0 lb

## 2019-11-06 DIAGNOSIS — R7303 Prediabetes: Secondary | ICD-10-CM | POA: Diagnosis not present

## 2019-11-06 DIAGNOSIS — E559 Vitamin D deficiency, unspecified: Secondary | ICD-10-CM | POA: Diagnosis not present

## 2019-11-06 DIAGNOSIS — Z6841 Body Mass Index (BMI) 40.0 and over, adult: Secondary | ICD-10-CM | POA: Diagnosis not present

## 2019-11-06 DIAGNOSIS — Z9189 Other specified personal risk factors, not elsewhere classified: Secondary | ICD-10-CM

## 2019-11-06 MED ORDER — VITAMIN D (ERGOCALCIFEROL) 1.25 MG (50000 UNIT) PO CAPS: 50000.0000 [IU] | ORAL_CAPSULE | ORAL | 0 refills | Status: DC

## 2019-11-07 DIAGNOSIS — F509 Eating disorder, unspecified: Secondary | ICD-10-CM | POA: Diagnosis not present

## 2019-11-08 NOTE — Progress Notes (Signed)
Chief Complaint:   OBESITY [name] is here to discuss her progress with her obesity treatment plan along with follow-up of her obesity related diagnoses. [name] is on the Category 2 Plan and states she is following her eating plan approximately 100% of the time. [name] states she is walking and Youtube exercise videos for 60-90 minutes 3-4 times per week.  Today's visit was #: 5 Starting weight: 323 lbs Starting date: 08/07/2019 Today's weight: 293 lbs Today's date: 11/06/2019 Total lbs lost to date: 30 Total lbs lost since last in-office visit: 6  Interim History: [name] has been following the plan 100% of the time. She denies hunger since increasing protein. Some days she isn't able to eat all of the food. She has been incorporating activity. She has done 1 dietician appointment and has her last psych evaluation tomorrow then pending insurance approval for bariatric surgery.  Subjective:   1. Vitamin D deficiency [name] denies nausea, vomiting, or muscle weakness, but she notes fatigue. Last Vit D level was of 7.1.  2. Pre-diabetes [name] is on metformin daily with minimal GI side effects. She notes minimal carbohydrate cravings.  3. At risk for osteoporosis [name] is at higher risk of osteopenia and osteoporosis due to Vitamin D deficiency.   Assessment/Plan:   1. Vitamin D deficiency Low Vitamin D level contributes to fatigue and are associated with obesity, breast, and colon cancer. We will refill prescription Vitamin D for 1 month. [name] will follow-up for routine testing of Vitamin D, at least 2-3 times per year to avoid over-replacement.  - Vitamin D, Ergocalciferol, (DRISDOL) 1.25 MG (50000 UNIT) CAPS capsule; Take 1 capsule (50,000 Units total) by mouth every 7 (seven) days.  Dispense: 4 capsule; Refill: 0  2. Pre-diabetes [name] will continue to work on weight loss, exercise, and decreasing simple carbohydrates to help decrease the risk of diabetes. We will repeat  labs in 1 month.  3. At risk for osteoporosis [name] was given approximately 15 minutes of osteoporosis prevention counseling today. [name] is at risk for osteopenia and osteoporosis due to her Vitamin D deficiency. She was encouraged to take her Vitamin D and follow her higher calcium diet and increase strengthening exercise to help strengthen her bones and decrease her risk of osteopenia and osteoporosis.  Repetitive spaced learning was employed today to elicit superior memory formation and behavioral change.  4. Class 3 severe obesity with serious comorbidity and body mass index (BMI) of 50.0 to 59.9 in adult, unspecified obesity type (HCC) [name] is currently in the action stage of change. As such, her goal is to continue with weight loss efforts. She has agreed to the Category 2 Plan.   Exercise goals: All adults should avoid inactivity. Some physical activity is better than none, and adults who participate in any amount of physical activity gain some health benefits.  Behavioral modification strategies: increasing lean protein intake, meal planning and cooking strategies, keeping healthy foods in the home and planning for success.  Mairim has agreed to follow-up with our clinic in 2 weeks. She was informed of the importance of frequent follow-up visits to maximize her success with intensive lifestyle modifications for her multiple health conditions.   Objective:   Blood pressure 118/81, pulse 86, temperature 98.4 F (36.9 C), temperature source Oral, height 5\' 2"  (1.575 m), weight 293 lb (132.9 kg), last menstrual period 10/28/2019, SpO2 97 %. Body mass index is 53.59 kg/m.  General: Cooperative, alert, well developed, in no acute distress. HEENT: Conjunctivae  and lids unremarkable. Cardiovascular: Regular rhythm.  Lungs: Normal work of breathing. Neurologic: No focal deficits.   Lab Results  Component Value Date   CREATININE 0.65 08/07/2019   BUN 13 08/07/2019   NA 137  08/07/2019   K 4.2 08/07/2019   CL 102 08/07/2019   CO2 22 08/07/2019   Lab Results  Component Value Date   ALT 13 08/07/2019   AST 10 08/07/2019   ALKPHOS 99 08/07/2019   BILITOT 0.3 08/07/2019   No results found for: HGBA1C No results found for: INSULIN Lab Results  Component Value Date   TSH 1.910 08/07/2019   No results found for: CHOL, HDL, LDLCALC, LDLDIRECT, TRIG, CHOLHDL Lab Results  Component Value Date   WBC 8.4 08/07/2019   HGB 11.7 08/07/2019   HCT 37.3 08/07/2019   MCV 78 (L) 08/07/2019   PLT 362 08/07/2019   Lab Results  Component Value Date   IRON 48 08/07/2019   TIBC 359 08/07/2019   FERRITIN 29 08/07/2019   Attestation Statements:   Reviewed by clinician on day of visit: allergies, medications, problem list, medical history, surgical history, family history, social history, and previous encounter notes.   I, Burt Knack, am acting as transcriptionist for Reuben Likes, MD.  I have reviewed the above documentation for accuracy and completeness, and I agree with the above. - Katherina Mires, MD

## 2019-11-20 ENCOUNTER — Ambulatory Visit (INDEPENDENT_AMBULATORY_CARE_PROVIDER_SITE_OTHER): Payer: BC Managed Care – PPO | Admitting: Family Medicine

## 2019-11-20 ENCOUNTER — Other Ambulatory Visit: Payer: Self-pay

## 2019-11-20 ENCOUNTER — Encounter (INDEPENDENT_AMBULATORY_CARE_PROVIDER_SITE_OTHER): Payer: Self-pay | Admitting: Family Medicine

## 2019-11-20 VITALS — BP 114/79 | HR 90 | Temp 98.2°F | Ht 62.0 in | Wt 292.0 lb

## 2019-11-20 DIAGNOSIS — R7303 Prediabetes: Secondary | ICD-10-CM | POA: Diagnosis not present

## 2019-11-20 DIAGNOSIS — Z6841 Body Mass Index (BMI) 40.0 and over, adult: Secondary | ICD-10-CM | POA: Diagnosis not present

## 2019-11-21 ENCOUNTER — Encounter (INDEPENDENT_AMBULATORY_CARE_PROVIDER_SITE_OTHER): Payer: Self-pay | Admitting: Family Medicine

## 2019-11-21 NOTE — Progress Notes (Signed)
. ° ° ° ° °  Chief Complaint:   OBESITY [name] is here to discuss her progress with her obesity treatment plan along with follow-up of her obesity related diagnoses. [name] is on the Category 2 Plan and states she is following her eating plan approximately 80-90% of the time. [name] states she is walking with resistance for 30-60 minutes 4-5 times per week.  Today's visit was #: 6 Starting weight: 323 lbs Starting date: 08/07/2019 Today's weight: 292 lbs Today's date: 11/20/2019 Total lbs lost to date: 31 Total lbs lost since last in-office visit: 1  Interim History: [name] has finished with psychological  and dietician approval, and she is waiting for a surgery date for weight loss surgery (sleeve gastrectomy). She denies excessive hunger. She is doing a great job with both activity and the meal plan. She has lost 31 lbs overall.  Subjective:   1. Pre-diabetes  Last A1c was 5.7. She is on metformin and denies polyphagia .No results found for: HGBA1C No results found for: INSULIN  Assessment/Plan:   1. Pre-diabetes [name] will continue metformin, and will continue to work on weight loss, exercise, and decreasing simple carbohydrates to help decrease the risk of diabetes.   2. Class 3 severe obesity with serious comorbidity and body mass index (BMI) of 50.0 to 59.9 in adult, unspecified obesity type (HCC) [name] is currently in the action stage of change. As such, her goal is to continue with weight loss efforts. She has agreed to the Category 3 Plan.   We will recheck fasting labs at her next office visit.  Handout given today: Category 3 plan.  Exercise goals: As is.  Behavioral modification strategies: meal planning and cooking strategies.  [name] has agreed to follow-up with our clinic in 3 weeks.  Objective:   Blood pressure 114/79, pulse 90, temperature 98.2 F (36.8 C), height 5\' 2"  (1.575 m), weight 292 lb (132.5 kg), last menstrual period 10/28/2019, SpO2 100 %. Body  mass index is 53.41 kg/m.  General: Cooperative, alert, well developed, in no acute distress. HEENT: Conjunctivae and lids unremarkable. Cardiovascular: Regular rhythm.  Lungs: Normal work of breathing. Neurologic: No focal deficits.   Lab Results  Component Value Date   CREATININE 0.65 08/07/2019   BUN 13 08/07/2019   NA 137 08/07/2019   K 4.2 08/07/2019   CL 102 08/07/2019   CO2 22 08/07/2019   Lab Results  Component Value Date   ALT 13 08/07/2019   AST 10 08/07/2019   ALKPHOS 99 08/07/2019   BILITOT 0.3 08/07/2019   No results found for: HGBA1C No results found for: INSULIN Lab Results  Component Value Date   TSH 1.910 08/07/2019   No results found for: CHOL, HDL, LDLCALC, LDLDIRECT, TRIG, CHOLHDL Lab Results  Component Value Date   WBC 8.4 08/07/2019   HGB 11.7 08/07/2019   HCT 37.3 08/07/2019   MCV 78 (L) 08/07/2019   PLT 362 08/07/2019   Lab Results  Component Value Date   IRON 48 08/07/2019   TIBC 359 08/07/2019   FERRITIN 29 08/07/2019   Attestation Statements:   Reviewed by clinician on day of visit: allergies, medications, problem list, medical history, surgical history, family history, social history, and previous encounter notes.   08/09/2019, am acting as Trude Mcburney for Energy manager, FNP-C.  I have reviewed the above documentation for accuracy and completeness, and I agree with the above. -  [place], FNP

## 2019-11-22 ENCOUNTER — Ambulatory Visit: Payer: Self-pay | Admitting: General Surgery

## 2019-11-30 ENCOUNTER — Other Ambulatory Visit (INDEPENDENT_AMBULATORY_CARE_PROVIDER_SITE_OTHER): Payer: Self-pay | Admitting: Family Medicine

## 2019-11-30 DIAGNOSIS — E559 Vitamin D deficiency, unspecified: Secondary | ICD-10-CM

## 2019-11-30 DIAGNOSIS — R7303 Prediabetes: Secondary | ICD-10-CM

## 2019-12-03 ENCOUNTER — Encounter (INDEPENDENT_AMBULATORY_CARE_PROVIDER_SITE_OTHER): Payer: Self-pay

## 2019-12-03 NOTE — Telephone Encounter (Signed)
Message sent to pt.

## 2019-12-10 ENCOUNTER — Other Ambulatory Visit: Payer: Self-pay

## 2019-12-10 ENCOUNTER — Encounter: Payer: BC Managed Care – PPO | Attending: General Surgery | Admitting: Dietician

## 2019-12-10 DIAGNOSIS — E669 Obesity, unspecified: Secondary | ICD-10-CM | POA: Insufficient documentation

## 2019-12-11 ENCOUNTER — Ambulatory Visit (INDEPENDENT_AMBULATORY_CARE_PROVIDER_SITE_OTHER): Payer: BC Managed Care – PPO | Admitting: Family Medicine

## 2019-12-11 ENCOUNTER — Encounter (INDEPENDENT_AMBULATORY_CARE_PROVIDER_SITE_OTHER): Payer: Self-pay

## 2019-12-11 ENCOUNTER — Encounter: Payer: Self-pay | Admitting: Dietician

## 2019-12-11 NOTE — Progress Notes (Signed)
Pre-Op Nutrition Class   Bariatric Nutrition Education Appt Start Time: 5:15pm       End Time: 7:15pm   Patient was seen on 12/10/2019 for Pre-Operative Bariatric Surgery Education at [facility].    Surgery date: 01/01/2020 Surgery type: Sleeve   Start weight at [facility]: 303.8 lbs (date: 10/24/2019) Weight today: 296 lbs BMI: 54.1 kg/m2   Samples Given per MNT Protocol (pt educated on appropriate usage) Multivitamin: ProCare Lot #[id number] Exp: 5/22  Calcium: Bariatric Fusion Lot #[id number] Exp:12/09/2019  Protein Drink: Protein2O Lot #[id number] Exp:12/28/20   The following the learning objectives were met by the patient during this course:  Identify Pre-Op Dietary Goals and begin 2 weeks prior to surgery  Identify appropriate sources of fluids and proteins   State protein recommendations and appropriate sources pre and post-operatively  Identify Post-Operative Dietary Goals and follow for 2 weeks post-operatively  Identify appropriate multivitamin and calcium sources  Describe the need for physical activity post-operatively and will follow MD recommendations  State when to call healthcare provider regarding medication questions or post-operative complications   Handouts given include:  Pre-Op Bariatric Surgery Diet Handout  Protein Shake Handout  Post-Op Bariatric Surgery Nutrition Handout  [facility] Program Information Flyer  Support Group Information Flyer  [facility] Bariatric Supplements Price List   Follow-Up Plan: Patient will follow-up at [facility] 2 weeks post operatively for diet advancement per MD.

## 2019-12-20 NOTE — Patient Instructions (Addendum)
DUE TO COVID-19 ONLY ONE VISITOR IS ALLOWED TO COME WITH YOU AND STAY IN THE WAITING ROOM ONLY DURING PRE OP AND PROCEDURE DAY OF SURGERY. THE 1 VISITOR  MAY VISIT WITH YOU AFTER SURGERY IN YOUR PRIVATE ROOM DURING VISITING HOURS ONLY!  YOU NEED TO HAVE A COVID 19 TEST ON__11/12_____ @_2 :00 pm______, THIS TEST MUST BE DONE BEFORE SURGERY,  COVID TESTING SITE [address] [place] , IT IS ON THE RIGHT GOING OUT [address] APPROXIMATELY  2 MINUTES PAST [employer] ON THE RIGHT. ONCE YOUR COVID TEST IS COMPLETED,  PLEASE BEGIN THE QUARANTINE INSTRUCTIONS AS OUTLINED IN YOUR HANDOUT.                [name]    Your procedure is scheduled on: 01/01/20   Report to [facility] Main  Entrance   Report to Short Stay at 5:30  AM     Call this number if you have problems the morning of surgery [phone]   . BRUSH YOUR TEETH MORNING OF SURGERY AND RINSE YOUR MOUTH OUT, NO CHEWING GUM CANDY OR MINTS.     NO SOLID FOOD AFTER 6:00 PM THE NIGHT BEFORE YOUR SURGERY.   YOU MAY DRINK CLEAR FLUIDS until 4:30 am   THE G2 DRINK YOU DRINK BEFORE YOU LEAVE HOME WILL BE THE LAST FLUIDS YOU DRINK BEFORE SURGERY.  PAIN IS EXPECTED AFTER SURGERY AND WILL NOT BE COMPLETELY ELIMINATED.   AMBULATION AND TYLENOL WILL HELP REDUCE INCISIONAL AND GAS PAIN. MOVEMENT IS KEY!  YOU ARE EXPECTED TO BE OUT OF BED WITHIN 4 HOURS OF ADMISSION TO YOUR PATIENT ROOM.  SITTING IN THE RECLINER THROUGHOUT THE DAY IS IMPORTANT FOR DRINKING FLUIDS AND MOVING GAS THROUGHOUT THE GI TRACT.  COMPRESSION STOCKINGS SHOULD BE WORN [facility] STAY UNLESS YOU ARE WALKING.   INCENTIVE SPIROMETER SHOULD BE USED EVERY HOUR WHILE AWAKE TO DECREASE POST-OPERATIVE COMPLICATIONS SUCH AS PNEUMONIA.  WHEN DISCHARGED HOME, IT IS IMPORTANT TO CONTINUE TO WALK EVERY HOUR AND USE THE INCENTIVE SPIROMETER EVERY HOUR.     Take these medicines the morning of surgery with A SIP OF  WATER: None  DO NOT TAKE ANY DIABETIC MEDICATIONS DAY OF YOUR SURGERY                               You may not have any metal on your body including hair pins and              piercings  Do not wear jewelry, make-up, lotions, powders or perfumes, deodorant             Do not wear nail polish on your fingernails.  Do not shave  48 hours prior to surgery.                Do not bring valuables to the hospital. [facility] IS NOT             RESPONSIBLE   FOR VALUABLES.  Contacts, dentures or bridgework may not be worn into surgery.      _____________________________________________________________________             [facility] - Preparing for Surgery Before surgery, you can play an important role.   Because skin is not sterile, your skin needs to be as free of germs as possible.   You can reduce the number of germs on your skin  by washing with CHG (chlorahexidine gluconate) soap before surgery.   CHG is an antiseptic cleaner which kills germs and bonds with the skin to continue killing germs even after washing. Please DO NOT use if you have an allergy to CHG or antibacterial soaps.   If your skin becomes reddened/irritated stop using the CHG and inform your nurse when you arrive at Short Stay. Do not shave (including legs and underarms) for at least 48 hours prior to the first CHG shower.   . Please follow these instructions carefully:  1.  Shower with CHG Soap the night before surgery and the  morning of Surgery.  2.  If you choose to wash your hair, wash your hair first as usual with your  normal  shampoo.  3.  After you shampoo, rinse your hair and body thoroughly to remove the  shampoo.                                        4.  Use CHG as you would any other liquid soap.  You can apply chg directly  to the skin and wash                       Gently with a scrungie or clean washcloth.  5.  Apply the CHG Soap to your body ONLY FROM THE NECK DOWN.   Do not use on face/ open                            Wound or open sores. Avoid contact with eyes, ears mouth and genitals (private parts).                       Wash face,  Genitals (private parts) with your normal soap.             6.  Wash thoroughly, paying special attention to the area where your surgery  will be performed.  7.  Thoroughly rinse your body with warm water from the neck down.  8.  DO NOT shower/wash with your normal soap after using and rinsing off  the CHG Soap.             9.  Pat yourself dry with a clean towel.            10.  Wear clean pajamas.            11.  Place clean sheets on your bed the night of your first shower and do not  sleep with pets. Day of Surgery : Do not apply any lotions/deodorants the morning of surgery.  Please wear clean clothes to the hospital/surgery center.  FAILURE TO FOLLOW THESE INSTRUCTIONS MAY RESULT IN THE CANCELLATION OF YOUR SURGERY PATIENT SIGNATURE_________________________________  NURSE SIGNATURE__________________________________  ________________________________________________________________________   [name]  An incentive spirometer is a tool that can help keep your lungs clear and active. This tool measures how well you are filling your lungs with each breath. Taking long deep breaths may help reverse or decrease the chance of developing breathing (pulmonary) problems (especially infection) following:  A long period of time when you are unable to move or be active. BEFORE THE PROCEDURE   If the spirometer includes an indicator to show your best effort, your nurse or respiratory therapist will set it  to a desired goal.  If possible, sit up straight or lean slightly forward. Try not to slouch.  Hold the incentive spirometer in an upright position. INSTRUCTIONS FOR USE  1. Sit on the edge of your bed if possible, or sit up as far as you can in bed or on a chair. 2. Hold the incentive spirometer in an upright position. 3. Breathe out  normally. 4. Place the mouthpiece in your mouth and seal your lips tightly around it. 5. Breathe in slowly and as deeply as possible, raising the piston or the ball toward the top of the column. 6. Hold your breath for 3-5 seconds or for as long as possible. Allow the piston or ball to fall to the bottom of the column. 7. Remove the mouthpiece from your mouth and breathe out normally. 8. Rest for a few seconds and repeat Steps 1 through 7 at least 10 times every 1-2 hours when you are awake. Take your time and take a few normal breaths between deep breaths. 9. The spirometer may include an indicator to show your best effort. Use the indicator as a goal to work toward during each repetition. 10. After each set of 10 deep breaths, practice coughing to be sure your lungs are clear. If you have an incision (the cut made at the time of surgery), support your incision when coughing by placing a pillow or rolled up towels firmly against it. Once you are able to get out of bed, walk around indoors and cough well. You may stop using the incentive spirometer when instructed by your caregiver.  RISKS AND COMPLICATIONS  Take your time so you do not get dizzy or light-headed.  If you are in pain, you may need to take or ask for pain medication before doing incentive spirometry. It is harder to take a deep breath if you are having pain. AFTER USE  Rest and breathe slowly and easily.  It can be helpful to keep track of a log of your progress. Your caregiver can provide you with a simple table to help with this. If you are using the spirometer at home, follow these instructions: SEEK MEDICAL CARE IF:   You are having difficultly using the spirometer.  You have trouble using the spirometer as often as instructed.  Your pain medication is not giving enough relief while using the spirometer.  You develop fever of 100.5 F (38.1 C) or higher. SEEK IMMEDIATE MEDICAL CARE IF:   You cough up bloody sputum  that had not been present before.  You develop fever of 102 F (38.9 C) or greater.  You develop worsening pain at or near the incision site. MAKE SURE YOU:   Understand these instructions.  Will watch your condition.  Will get help right away if you are not doing well or get worse. Document Released: 06/14/2006 Document Revised: 04/26/2011 Document Reviewed: 08/15/2006 [facility] Patient Information 2014 [place], [place].   ________________________________________________________________________

## 2019-12-21 ENCOUNTER — Other Ambulatory Visit: Payer: Self-pay

## 2019-12-21 ENCOUNTER — Encounter (HOSPITAL_COMMUNITY)
Admission: RE | Admit: 2019-12-21 | Discharge: 2019-12-21 | Disposition: A | Discharge status: Home / Self Care | Payer: BC Managed Care – PPO | Source: Ambulatory Visit | Attending: General Surgery | Admitting: General Surgery

## 2019-12-21 ENCOUNTER — Encounter (HOSPITAL_COMMUNITY): Payer: Self-pay

## 2019-12-21 DIAGNOSIS — Z01812 Encounter for preprocedural laboratory examination: Secondary | ICD-10-CM | POA: Insufficient documentation

## 2019-12-21 HISTORY — DX: Prediabetes: R73.03

## 2019-12-21 LAB — COMPREHENSIVE METABOLIC PANEL
ALT: 16 U/L (ref 0–44)
AST: 15 U/L (ref 15–41)
Albumin: 4 g/dL (ref 3.5–5.0)
Alkaline Phosphatase: 72 U/L (ref 38–126)
Anion gap: 10 (ref 5–15)
BUN: 16 mg/dL (ref 6–20)
CO2: 26 mmol/L (ref 22–32)
Calcium: 9 mg/dL (ref 8.9–10.3)
Chloride: 102 mmol/L (ref 98–111)
Creatinine, Ser: 0.7 mg/dL (ref 0.44–1.00)
GFR, Estimated: >60 mL/min (ref >60)
Glucose, Bld: 90 mg/dL (ref 70–99)
Potassium: 4.4 mmol/L (ref 3.5–5.1)
Sodium: 138 mmol/L (ref 135–145)
Total Bilirubin: 0.5 mg/dL (ref 0.3–1.2)
Total Protein: 7.9 g/dL (ref 6.5–8.1)

## 2019-12-21 LAB — TYPE AND SCREEN
ABO/RH(D): O POS
Antibody Screen: NEGATIVE

## 2019-12-21 LAB — CBC WITH DIFFERENTIAL/PLATELET
Abs Immature Granulocytes: 0.02 10*3/uL (ref 0.00–0.07)
Basophils Absolute: 0.1 10*3/uL (ref 0.0–0.1)
Basophils Relative: 1 %
Eosinophils Absolute: 0.4 10*3/uL (ref 0.0–0.5)
Eosinophils Relative: 4 %
HCT: 37.4 % (ref 36.0–46.0)
Hemoglobin: 11.8 g/dL — ABNORMAL LOW (ref 12.0–15.0)
Immature Granulocytes: 0 %
Lymphocytes Relative: 36 %
Lymphs Abs: 3.6 10*3/uL (ref 0.7–4.0)
MCH: 24.6 pg — ABNORMAL LOW (ref 26.0–34.0)
MCHC: 31.6 g/dL (ref 30.0–36.0)
MCV: 77.9 fL — ABNORMAL LOW (ref 80.0–100.0)
Monocytes Absolute: 0.9 10*3/uL (ref 0.1–1.0)
Monocytes Relative: 9 %
Neutro Abs: 5 10*3/uL (ref 1.7–7.7)
Neutrophils Relative %: 50 %
Platelets: 367 10*3/uL (ref 150–400)
RBC: 4.8 MIL/uL (ref 3.87–5.11)
RDW: 15.9 % — ABNORMAL HIGH (ref 11.5–15.5)
WBC: 9.9 10*3/uL (ref 4.0–10.5)
nRBC: 0 % (ref 0.0–0.2)

## 2019-12-21 LAB — HEMOGLOBIN A1C
Hgb A1c MFr Bld: 5.8 % — ABNORMAL HIGH (ref 4.8–5.6)
Mean Plasma Glucose: 119.76 mg/dL

## 2019-12-21 NOTE — Progress Notes (Signed)
COVID Vaccine Completed:Yes Date COVID Vaccine completed:05/05/19 COVID vaccine manufacturer: Pfizer     PCP - [employer] [facility] Cardiologist - none  Chest x-ray - 09/21/19 EKG - 08/07/19 Stress Test - no ECHO - no Cardiac Cath - no Pacemaker/ICD device last checked:NA  Sleep Study - yes-mild CPAP - no  Fasting Blood Sugar - Pt is pre diabetic and takes metformin for appetite supression Checks Blood Sugar _____ times a day  Blood Thinner Instructions:NA Aspirin Instructions: Last Dose:  Anesthesia review:   Patient denies shortness of breath, fever, cough and chest pain at PAT appointment yes   Patient verbalized understanding of instructions that were given to them at the PAT appointment. Patient was also instructed that they will need to review over the PAT instructions again at home before surgery. Yes Pt reports no SOB climbing stairs, doing housework or ADLs. She said that she is out of shape.

## 2019-12-26 ENCOUNTER — Other Ambulatory Visit: Payer: Self-pay

## 2019-12-26 ENCOUNTER — Encounter (INDEPENDENT_AMBULATORY_CARE_PROVIDER_SITE_OTHER): Payer: Self-pay | Admitting: Family Medicine

## 2019-12-26 ENCOUNTER — Ambulatory Visit (INDEPENDENT_AMBULATORY_CARE_PROVIDER_SITE_OTHER): Payer: BC Managed Care – PPO | Admitting: Family Medicine

## 2019-12-26 VITALS — BP 110/72 | HR 89 | Temp 98.0°F | Ht 62.0 in | Wt 285.0 lb

## 2019-12-26 DIAGNOSIS — Z6841 Body Mass Index (BMI) 40.0 and over, adult: Secondary | ICD-10-CM | POA: Diagnosis not present

## 2019-12-26 DIAGNOSIS — E66813 Obesity, class 3: Secondary | ICD-10-CM

## 2019-12-26 DIAGNOSIS — E559 Vitamin D deficiency, unspecified: Secondary | ICD-10-CM

## 2019-12-27 ENCOUNTER — Ambulatory Visit: Payer: Self-pay | Admitting: General Surgery

## 2019-12-27 DIAGNOSIS — E559 Vitamin D deficiency, unspecified: Secondary | ICD-10-CM | POA: Diagnosis not present

## 2019-12-27 DIAGNOSIS — E282 Polycystic ovarian syndrome: Secondary | ICD-10-CM | POA: Diagnosis not present

## 2019-12-27 DIAGNOSIS — R7303 Prediabetes: Secondary | ICD-10-CM | POA: Diagnosis not present

## 2019-12-27 NOTE — Progress Notes (Signed)
Chief Complaint:   OBESITY [name] is here to discuss her progress with her obesity treatment plan along with follow-up of her obesity related diagnoses. [name] is on the Category 3 Plan and states she is following her eating plan approximately 0% of the time. [name] states she is walking and doing video for 30-90 minutes 3-4 times per week.  Today's visit was #: 7 Starting weight: 323 lbs Starting date: 08/07/2019 Today's weight: 285 lbs Today's date: 12/26/2019 Total lbs lost to date: 38 Total lbs lost since last in-office visit: 7  Interim History: [name] has been on the liver shrinking diet for 1 week. She is scheduled for sleeve gastrectomy next week. She is excited about her surgery. She would like to continue to follow up with [place] post-op. She has lost 38 lbs since starting at our clinic in June. I believe she will do quite well with bariatric surgery. She has proven that she can make the necessary changes to be successful postop.   Subjective:   1. Vitamin D deficiency [name]'s last Vit D level was low 7.1. She is off of Vit D in preparation for weight loss surgery.  Assessment/Plan:   1. Vitamin D deficiency  [name] agreed to resume prescription Vitamin D 1 week post-op. She will follow-up for routine testing of Vitamin D, at least 2-3 times per year to avoid over-replacement.  2. Class 3 severe obesity with serious comorbidity and body mass index (BMI) of 50.0 to 59.9 in adult, unspecified obesity type (HCC) [name] is currently in the action stage of change. As such, her goal is to continue with weight loss efforts. She has agreed to following a lower carbohydrate, vegetable and lean protein rich diet plan.   [name] was encouraged to stay hydrated post-op with broth, protein shakes, water, and Gatorade zero.  Exercise goals: As is.  Behavioral modification strategies: increasing lean protein intake and increasing water intake. We discussed that bariatric surgery is a  tool for weight loss and will not be successful long-term without continued dietary changes and exercise.  [name] has agreed to follow-up with our clinic in 10 weeks.   Objective:   Blood pressure 110/72, pulse 89, temperature 98 F (36.7 C), height 5\' 2"  (1.575 m), weight 285 lb (129.3 kg), last menstrual period 12/01/2019, SpO2 98 %. Body mass index is 52.13 kg/m.  General: Cooperative, alert, well developed, in no acute distress. HEENT: Conjunctivae and lids unremarkable. Cardiovascular: Regular rhythm.  Lungs: Normal work of breathing. Neurologic: No focal deficits.   Lab Results  Component Value Date   CREATININE 0.70 12/21/2019   BUN 16 12/21/2019   NA 138 12/21/2019   K 4.4 12/21/2019   CL 102 12/21/2019   CO2 26 12/21/2019   Lab Results  Component Value Date   ALT 16 12/21/2019   AST 15 12/21/2019   ALKPHOS 72 12/21/2019   BILITOT 0.5 12/21/2019   Lab Results  Component Value Date   HGBA1C 5.8 (H) 12/21/2019   No results found for: INSULIN Lab Results  Component Value Date   TSH 1.910 08/07/2019   No results found for: CHOL, HDL, LDLCALC, LDLDIRECT, TRIG, CHOLHDL Lab Results  Component Value Date   WBC 9.9 12/21/2019   HGB 11.8 (L) 12/21/2019   HCT 37.4 12/21/2019   MCV 77.9 (L) 12/21/2019   PLT 367 12/21/2019   Lab Results  Component Value Date   IRON 48 08/07/2019   TIBC 359 08/07/2019   FERRITIN 29 08/07/2019  Attestation Statements:   Reviewed by clinician on day of visit: allergies, medications, problem list, medical history, surgical history, family history, social history, and previous encounter notes.   Wilhemena Durie, am acting as Location manager for [employer], FNP-C.  I have reviewed the above documentation for accuracy and completeness, and I agree with the above. -  Georgianne Fick, FNP

## 2019-12-27 NOTE — H&P (Signed)
[name] Appointment: 12/27/2019 10:15 AM Location: [facility] Patient #: [id number] DOB: 07/07/1995 Single / Language: English / Race: Black or African American Female  History of Present Illness (Kemon Devincenzi M. Bertina Guthridge MD; 12/27/2019 10:48 AM) The patient is a 24 year old female who presents for a bariatric surgery evaluation. She comes in for long-term follow-up regarding her struggled with weight. She has completed the bariatric surgery evaluation process. She has received psychological as well as nutritional clearance. She did undergo a sleep study that showed mild obstructive sleep apnea. Otherwise no changes. She has continued to work with the bariatric medicine program and follow their recommendations and has lost some additional weight since her initial visit with me. Her upper GI was negative. Chest x-ray unremarkable. Additional labs that I checked were within normal limits except for A1c of 5.7. Lipid panel normal. No chest pain, chest pressure, source of breath, dyspnea on exertion, tobacco use. Denies changes since I last saw. Has had her preoperative education class  09/20/19 Patient comes in to discuss weight loss surgery. Her primary care [facility]. She completed our seminar online. She is interested in sleeve gastrectomy. She believes is not as invasive as Roux-en-Y. She is interested in improving her health. Despite numerous attempts for sustained weight loss she has been unsuccessful. She has worked with the medical bariatrician at [facility]. She has also done intermittent fasting and ketogenic diets-all without any long-term success.  her co-morbidities are prediabetes and PCOS  She denies any chest pain, chest pressure, shortness of breath, orthopnea, paroxysmal nocturnal dyspnea. She denies any dyspnea on exertion. She denies any TIAs or amaurosis fugax. She states her dad has  CHF. She denies any personal or family history of blood clots. She is scheduled for sleep study on August 16. She denies any GERD or heartburn or indigestion. She denies any trouble swallowing. She denies any prior abdominal surgery. She generally has a bowel movement at least daily. She denies any prior abdominal surgery. She denies any dysuria or hematuria. She does have PCOS. She does have some intermittent low back pain without sciatica. She states that she was told she was prediabetic. She denies any migraines or vision changes.  She denies any tobacco or drugs. She drinks alcohol socially. She is a teacher's assistant.  She had labs on June 22 which I reviewed. She had a vitamin D deficiency at 7. Thyroid function was normal. hemoGlobin was 11.7. MCV 78. Iron 48, ferritin 29, folate and B12 normal  (delayed note entry -used notes taken during visit)    Problem List/Past Medical (Janille Draughon M. Leevi Cullars, MD; 12/27/2019 10:50 AM) VITAMIN D DEFICIENCY (E55.9) PREDIABETES (R73.03) PCOS (POLYCYSTIC OVARIAN SYNDROME) (E28.2) SEVERE OBESITY (E66.01) Patient meets weight loss surgery criteria. MILD OBSTRUCTIVE SLEEP APNEA (G47.33)  Diagnostic Studies History (Elisa Sorlie M. Rielle Schlauch, MD; 12/27/2019 10:50 AM) Colonoscopy never Mammogram never Pap Smear 1-5 years ago  Allergies (Tanisha A. Brown, RMA; 12/27/2019 10:18 AM) Contrast Allergy PreMed Pack *CORTICOSTEROIDS* Allergies Reconciled  Medication History (Tanisha A. Brown, RMA; 12/27/2019 10:19 AM) metFORMIN HCl (500MG Tablet, Oral) Active. Medications Reconciled  Social History (Yanely Mast M. Ron Beske, MD; 12/27/2019 10:50 AM) Alcohol use Occasional alcohol use. Caffeine use Carbonated beverages, Tea. No drug use Tobacco use Never smoker.  Family History (Taevyn Hausen M. Versia Mignogna, MD; 12/27/2019 10:50 AM) Diabetes Mellitus Father, Mother. Heart Disease Father. Heart disease in female family member before age 55  Pregnancy /    Birth History [name] Andrey Campanile, MD; 12/27/2019 10:50 AM) Contraceptive History Oral contraceptives. Gravida 0 Irregular periods Para 0  Other Problems [name] Andrey Campanile, MD; 12/27/2019 10:50 AM) Asthma Back Pain     Review of Systems [name] Maleeka Sabatino MD; 12/27/2019 10:48 AM) All other systems negative  Vitals (Tanisha A. Brown RMA; 12/27/2019 10:19 AM) 12/27/2019 10:19 AM Weight: 285.4 lb Height: 63in Body Surface Area: 2.25 m Body Mass Index: 50.56 kg/m  Temp.: 97.15F  Pulse: 98 (Regular)  BP: 128/74(Sitting, Left Arm, Standard)        Physical Exam [name] Ferne Ellingwood MD; 12/27/2019 10:48 AM)  The physical exam findings are as follows: Note:severe obesity  General Mental Status-Alert. General Appearance-Consistent with stated age. Hydration-Well hydrated. Voice-Normal.  Head and Neck Head-normocephalic, atraumatic with no lesions or palpable masses. Trachea-midline. Thyroid Gland Characteristics - normal size and consistency.  Eye Eyeball - Bilateral-Extraocular movements intact. Sclera/Conjunctiva - Bilateral-No scleral icterus.  Chest and Lung Exam Chest and lung exam reveals -quiet, even and easy respiratory effort with no use of accessory muscles and on auscultation, normal breath sounds, no adventitious sounds and normal vocal resonance. Inspection Chest Wall - Normal. Back - normal.  Breast - Did not examine.  Cardiovascular Cardiovascular examination reveals -normal heart sounds, regular rate and rhythm with no murmurs and normal pedal pulses bilaterally.  Abdomen Inspection Inspection of the abdomen reveals - No Hernias. Skin - Scar - no surgical scars. Palpation/Percussion Palpation and Percussion of the abdomen reveal - Soft, Non Tender, No Rebound tenderness, No Rigidity (guarding) and No hepatosplenomegaly. Auscultation Auscultation of the abdomen reveals - Bowel sounds normal.  Peripheral  Vascular Upper Extremity Palpation - Pulses bilaterally normal.  Neurologic Neurologic evaluation reveals -alert and oriented x 3 with no impairment of recent or remote memory. Mental Status-Normal.  Neuropsychiatric The patient's mood and affect are described as -normal. Judgment and Insight-insight is appropriate concerning matters relevant to self.  Musculoskeletal Normal Exam - Left-Upper Extremity Strength Normal and Lower Extremity Strength Normal. Normal Exam - Right-Upper Extremity Strength Normal and Lower Extremity Strength Normal.  Lymphatic Head & Neck  General Head & Neck Lymphatics: Bilateral - Description - Normal. Axillary - Did not examine. Femoral & Inguinal - Did not examine.    Assessment & Plan [name] Hamish Banks MD; 12/27/2019 10:50 AM)  PREDIABETES (R73.03)   SEVERE OBESITY (E66.01) Story: Patient meets weight loss surgery criteria. Impression: The patient meets weight loss surgery criteria. I think the patient would be an acceptable candidate for Laparoscopic vertical sleeve gastrectomy.  Patient has completed her bariatric surgery evaluation process and has received psychological and nutritional clearance. We reviewed her workup. We discussed her upper GI, sleep study. We rediscussed the typical hospitalization as well as the typical recovery period she has had her preoperative education class.  All of her questions were asked and answered. I offered to rediscussed the steps along with risk and benefits of surgery but she declined. She has completed the surgical consent form.  This patient encounter took 20 minutes today to perform the following: take history, perform exam, review outside records, interpret imaging, counsel the patient on their diagnosis and document encounter, findings & plan in the EHR  Current Plans Pt Education - [facility] Free Text Education/Instructions: discussed with patient and provided information.  VITAMIN D DEFICIENCY  (E55.9)   PCOS (POLYCYSTIC OVARIAN SYNDROME) (E28.2)   MILD OBSTRUCTIVE SLEEP APNEA (G47.33)  Mary Sella. Andrey Campanile, MD, FACS General, Bariatric, & Minimally Invasive Surgery [facility]  [place]

## 2019-12-27 NOTE — H&P (View-Only) (Signed)
[name] Appointment: 12/27/2019 10:15 AM Location: [facility] Patient #: [id number] DOB: [birth year] Single / Language: [place] / Race: Black or African American Female  History of Present Illness Tammy Areola M. [name] MD; 12/27/2019 10:48 AM) The patient is a 24 year old female who presents for a bariatric surgery evaluation. She comes in for long-term follow-up regarding her struggled with weight. She has completed the bariatric surgery evaluation process. She has received psychological as well as nutritional clearance. She did undergo a sleep study that showed mild obstructive sleep apnea. Otherwise no changes. She has continued to work with the bariatric medicine program and follow their recommendations and has lost some additional weight since her initial visit with me. Her upper GI was negative. Chest x-ray unremarkable. Additional labs that I checked were within normal limits except for A1c of 5.7. Lipid panel normal. No chest pain, chest pressure, source of breath, dyspnea on exertion, tobacco use. Denies changes since I last saw. Has had her preoperative education class  09/20/19 Patient comes in to discuss weight loss surgery. Her primary care team is at the [facility]. She completed our seminar online. She is interested in sleeve gastrectomy. She believes is not as invasive as Roux-en-Y. She is interested in improving her health. Despite numerous attempts for sustained weight loss she has been unsuccessful. She has worked with the medical bariatrician at [facility]. She has also done intermittent fasting and ketogenic diets-all without any long-term success.  her co-morbidities are prediabetes and PCOS  She denies any chest pain, chest pressure, shortness of breath, orthopnea, paroxysmal nocturnal dyspnea. She denies any dyspnea on exertion. She denies any TIAs or amaurosis fugax. She states her dad has  CHF. She denies any personal or family history of blood clots. She is scheduled for sleep study on August 16. She denies any GERD or heartburn or indigestion. She denies any trouble swallowing. She denies any prior abdominal surgery. She generally has a bowel movement at least daily. She denies any prior abdominal surgery. She denies any dysuria or hematuria. She does have PCOS. She does have some intermittent low back pain without sciatica. She states that she was told she was prediabetic. She denies any migraines or vision changes.  She denies any tobacco or drugs. She drinks alcohol socially. She is a Architectural technologist.  She had labs on June 22 which I reviewed. She had a vitamin D deficiency at 7. Thyroid function was normal. hemoGlobin was 11.7. MCV 78. Iron 48, ferritin 29, folate and B12 normal  (delayed note entry -used notes taken during visit)    Problem List/Past Medical Tammy Areola M. Andrey Campanile, MD; 12/27/2019 10:50 AM) VITAMIN D DEFICIENCY (E55.9) PREDIABETES (R73.03) PCOS (POLYCYSTIC OVARIAN SYNDROME) (E28.2) SEVERE OBESITY (E66.01) Patient meets weight loss surgery criteria. MILD OBSTRUCTIVE SLEEP APNEA (G47.33)  Diagnostic Studies History Tammy Areola M. Andrey Campanile, MD; 12/27/2019 10:50 AM) Colonoscopy never Mammogram never Pap Smear 1-5 years ago  Allergies (Tanisha A. Manson Passey, RMA; 12/27/2019 10:18 AM) Contrast Allergy PreMed Pack *CORTICOSTEROIDS* Allergies Reconciled  Medication History (Tanisha A. Manson Passey, RMA; 12/27/2019 10:19 AM) metFORMIN HCl (500MG  Tablet, Oral) Active. Medications Reconciled  Social History M. M, MD; 12/27/2019 10:50 AM) Alcohol use Occasional alcohol use. Caffeine use Carbonated beverages, Tea. No drug use Tobacco use Never smoker.  Family History 13/12/2019 M. M, MD; 12/27/2019 10:50 AM) Diabetes Mellitus Father, Mother. Heart Disease Father. Heart disease in female family member before age 39  Pregnancy /  Birth History Tammy Areola M. Andrey Campanile, MD; 12/27/2019 10:50 AM) Contraceptive History Oral contraceptives. Gravida 0 Irregular periods Para 0  Other Problems Tammy Areola M. Andrey Campanile, MD; 12/27/2019 10:50 AM) Asthma Back Pain     Review of Systems Tammy Areola M. Errika Narvaiz MD; 12/27/2019 10:48 AM) All other systems negative  Vitals (Tanisha A. Brown RMA; 12/27/2019 10:19 AM) 12/27/2019 10:19 AM Weight: 285.4 lb Height: 63in Body Surface Area: 2.25 m Body Mass Index: 50.56 kg/m  Temp.: 97.15F  Pulse: 98 (Regular)  BP: 128/74(Sitting, Left Arm, Standard)        Physical Exam Tammy Areola M. Francies Inch MD; 12/27/2019 10:48 AM)  The physical exam findings are as follows: Note:severe obesity  General Mental Status-Alert. General Appearance-Consistent with stated age. Hydration-Well hydrated. Voice-Normal.  Head and Neck Head-normocephalic, atraumatic with no lesions or palpable masses. Trachea-midline. Thyroid Gland Characteristics - normal size and consistency.  Eye Eyeball - Bilateral-Extraocular movements intact. Sclera/Conjunctiva - Bilateral-No scleral icterus.  Chest and Lung Exam Chest and lung exam reveals -quiet, even and easy respiratory effort with no use of accessory muscles and on auscultation, normal breath sounds, no adventitious sounds and normal vocal resonance. Inspection Chest Wall - Normal. Back - normal.  Breast - Did not examine.  Cardiovascular Cardiovascular examination reveals -normal heart sounds, regular rate and rhythm with no murmurs and normal pedal pulses bilaterally.  Abdomen Inspection Inspection of the abdomen reveals - No Hernias. Skin - Scar - no surgical scars. Palpation/Percussion Palpation and Percussion of the abdomen reveal - Soft, Non Tender, No Rebound tenderness, No Rigidity (guarding) and No hepatosplenomegaly. Auscultation Auscultation of the abdomen reveals - Bowel sounds normal.  Peripheral  Vascular Upper Extremity Palpation - Pulses bilaterally normal.  Neurologic Neurologic evaluation reveals -alert and oriented x 3 with no impairment of recent or remote memory. Mental Status-Normal.  Neuropsychiatric The patient's mood and affect are described as -normal. Judgment and Insight-insight is appropriate concerning matters relevant to self.  Musculoskeletal Normal Exam - Left-Upper Extremity Strength Normal and Lower Extremity Strength Normal. Normal Exam - Right-Upper Extremity Strength Normal and Lower Extremity Strength Normal.  Lymphatic Head & Neck  General Head & Neck Lymphatics: Bilateral - Description - Normal. Axillary - Did not examine. Femoral & Inguinal - Did not examine.    Assessment & Plan Tammy Areola M. Ravan Schlemmer MD; 12/27/2019 10:50 AM)  PREDIABETES (R73.03)   SEVERE OBESITY (E66.01) Story: Patient meets weight loss surgery criteria. Impression: The patient meets weight loss surgery criteria. I think the patient would be an acceptable candidate for Laparoscopic vertical sleeve gastrectomy.  Patient has completed her bariatric surgery evaluation process and has received psychological and nutritional clearance. We reviewed her workup. We discussed her upper GI, sleep study. We rediscussed the typical hospitalization as well as the typical recovery period she has had her preoperative education class.  All of her questions were asked and answered. I offered to rediscussed the steps along with risk and benefits of surgery but she declined. She has completed the surgical consent form.  This patient encounter took 20 minutes today to perform the following: take history, perform exam, review outside records, interpret imaging, counsel the patient on their diagnosis and document encounter, findings & plan in the EHR  Current Plans Pt Education - [facility] Free Text Education/Instructions: discussed with patient and provided information.  VITAMIN D DEFICIENCY  (E55.9)   PCOS (POLYCYSTIC OVARIAN SYNDROME) (E28.2)   MILD OBSTRUCTIVE SLEEP APNEA (G47.33)  Mary Sella. Andrey Campanile, MD, FACS General, Bariatric, & Minimally Invasive Surgery [facility]  [place]

## 2019-12-28 ENCOUNTER — Other Ambulatory Visit (HOSPITAL_COMMUNITY)
Admission: RE | Admit: 2019-12-28 | Discharge: 2019-12-28 | Disposition: A | Discharge status: Home / Self Care | Payer: BC Managed Care – PPO | Source: Ambulatory Visit | Attending: General Surgery | Admitting: General Surgery

## 2019-12-28 DIAGNOSIS — Z20822 Contact with and (suspected) exposure to covid-19: Secondary | ICD-10-CM | POA: Diagnosis not present

## 2019-12-28 DIAGNOSIS — Z01812 Encounter for preprocedural laboratory examination: Secondary | ICD-10-CM | POA: Insufficient documentation

## 2019-12-28 LAB — SARS CORONAVIRUS 2 (TAT 6-24 HRS): SARS Coronavirus 2: NEGATIVE

## 2019-12-31 MED ORDER — BUPIVACAINE LIPOSOME 1.3 % IJ SUSP
20.0000 mL | INTRAMUSCULAR | Status: DC
  Filled 2019-12-31: qty 20

## 2019-12-31 NOTE — Anesthesia Preprocedure Evaluation (Addendum)
Anesthesia Evaluation  Patient identified by MRN, date of birth, ID band Patient awake    Reviewed: Allergy & Precautions, NPO status , Patient's Chart, lab work & pertinent test results  Airway Mallampati: II  TM Distance: >3 FB Neck ROM: Full    Dental no notable dental hx.    Pulmonary asthma ,    breath sounds clear to auscultation       Cardiovascular Exercise Tolerance: Good Normal cardiovascular exam Rhythm:Regular Rate:Normal     Neuro/Psych PSYCHIATRIC DISORDERS Depression    GI/Hepatic negative GI ROS, Neg liver ROS,   Endo/Other  diabetes (pre-diabetes)Morbid obesity (super morbid obesity BMI 52)PCOS   Renal/GU negative Renal ROS     Musculoskeletal Back pain   Abdominal Normal abdominal exam  (+)   Peds negative pediatric ROS (+)  Hematology  (+) anemia ,   Anesthesia Other Findings   Reproductive/Obstetrics                            Anesthesia Physical Anesthesia Plan  ASA: IV  Anesthesia Plan: General   Post-op Pain Management:    Induction: Intravenous  PONV Risk Score and Plan: 3 and Scopolamine patch - Pre-op, Dexamethasone and Ondansetron  Airway Management Planned: Oral ETT  Additional Equipment:   Intra-op Plan:   Post-operative Plan: Extubation in OR  Informed Consent: I have reviewed the patients History and Physical, chart, labs and discussed the procedure including the risks, benefits and alternatives for the proposed anesthesia with the patient or authorized representative who has indicated his/her understanding and acceptance.       Plan Discussed with: CRNA, Anesthesiologist and Surgeon  Anesthesia Plan Comments:        Anesthesia Quick Evaluation

## 2020-01-01 ENCOUNTER — Inpatient Hospital Stay (HOSPITAL_COMMUNITY)
Admission: RE | Admit: 2020-01-01 | Discharge: 2020-01-02 | DRG: 621 | Disposition: A | Discharge status: Home / Self Care | Payer: BC Managed Care – PPO | Attending: General Surgery | Admitting: General Surgery

## 2020-01-01 ENCOUNTER — Encounter (HOSPITAL_COMMUNITY): Payer: Self-pay | Admitting: General Surgery

## 2020-01-01 ENCOUNTER — Inpatient Hospital Stay (HOSPITAL_COMMUNITY): Payer: BC Managed Care – PPO | Admitting: Anesthesiology

## 2020-01-01 ENCOUNTER — Other Ambulatory Visit: Payer: Self-pay

## 2020-01-01 ENCOUNTER — Encounter (HOSPITAL_COMMUNITY)
Admission: RE | Disposition: A | Discharge status: Home / Self Care | Payer: Self-pay | Source: Home / Self Care | Attending: General Surgery

## 2020-01-01 DIAGNOSIS — Z91041 Radiographic dye allergy status: Secondary | ICD-10-CM | POA: Diagnosis not present

## 2020-01-01 DIAGNOSIS — Z833 Family history of diabetes mellitus: Secondary | ICD-10-CM | POA: Diagnosis not present

## 2020-01-01 DIAGNOSIS — R7303 Prediabetes: Secondary | ICD-10-CM | POA: Diagnosis not present

## 2020-01-01 DIAGNOSIS — Z9884 Bariatric surgery status: Secondary | ICD-10-CM

## 2020-01-01 DIAGNOSIS — J45909 Unspecified asthma, uncomplicated: Secondary | ICD-10-CM | POA: Diagnosis not present

## 2020-01-01 DIAGNOSIS — Z6841 Body Mass Index (BMI) 40.0 and over, adult: Secondary | ICD-10-CM

## 2020-01-01 DIAGNOSIS — G4733 Obstructive sleep apnea (adult) (pediatric): Secondary | ICD-10-CM | POA: Diagnosis present

## 2020-01-01 DIAGNOSIS — E559 Vitamin D deficiency, unspecified: Secondary | ICD-10-CM | POA: Diagnosis present

## 2020-01-01 DIAGNOSIS — Z8249 Family history of ischemic heart disease and other diseases of the circulatory system: Secondary | ICD-10-CM

## 2020-01-01 DIAGNOSIS — E282 Polycystic ovarian syndrome: Secondary | ICD-10-CM | POA: Diagnosis not present

## 2020-01-01 DIAGNOSIS — Z793 Long term (current) use of hormonal contraceptives: Secondary | ICD-10-CM

## 2020-01-01 HISTORY — PX: LAPAROSCOPIC GASTRIC SLEEVE RESECTION: SHX5895

## 2020-01-01 HISTORY — PX: UPPER GI ENDOSCOPY: SHX6162

## 2020-01-01 LAB — HEMOGLOBIN AND HEMATOCRIT, BLOOD
HCT: 39.2 % (ref 36.0–46.0)
Hemoglobin: 12.3 g/dL (ref 12.0–15.0)

## 2020-01-01 LAB — ABO/RH: ABO/RH(D): O POS

## 2020-01-01 LAB — PREGNANCY, URINE: Preg Test, Ur: NEGATIVE

## 2020-01-01 SURGERY — GASTRECTOMY, SLEEVE, LAPAROSCOPIC
Anesthesia: General | Site: Abdomen

## 2020-01-01 MED ORDER — DEXAMETHASONE SODIUM PHOSPHATE 4 MG/ML IJ SOLN: 4.0000 mg | INTRAMUSCULAR | Status: DC

## 2020-01-01 MED ORDER — AMISULPRIDE (ANTIEMETIC) 5 MG/2ML IV SOLN: 10.0000 mg | Freq: Once | INTRAVENOUS | Status: DC | PRN

## 2020-01-01 MED ORDER — SUGAMMADEX SODIUM 500 MG/5ML IV SOLN
INTRAVENOUS | Status: DC | PRN
  Administered 2020-01-01: 300 mg via INTRAVENOUS

## 2020-01-01 MED ORDER — HYDROMORPHONE HCL 1 MG/ML IJ SOLN
0.2500 mg | INTRAMUSCULAR | Status: DC | PRN
  Administered 2020-01-01: 0.25 mg via INTRAVENOUS
  Administered 2020-01-01: 0.5 mg via INTRAVENOUS
  Administered 2020-01-01: 0.25 mg via INTRAVENOUS

## 2020-01-01 MED ORDER — BUPIVACAINE LIPOSOME 1.3 % IJ SUSP
INTRAMUSCULAR | Status: DC | PRN
  Administered 2020-01-01: 20 mL

## 2020-01-01 MED ORDER — CHLORHEXIDINE GLUCONATE 4 % EX LIQD: 60.0000 mL | Freq: Once | CUTANEOUS | Status: DC

## 2020-01-01 MED ORDER — APREPITANT 40 MG PO CAPS
40.0000 mg | ORAL_CAPSULE | ORAL | Status: AC
  Administered 2020-01-01: 40 mg via ORAL
  Filled 2020-01-01: qty 1

## 2020-01-01 MED ORDER — GABAPENTIN 300 MG PO CAPS
300.0000 mg | ORAL_CAPSULE | ORAL | Status: AC
  Administered 2020-01-01: 300 mg via ORAL
  Filled 2020-01-01: qty 1

## 2020-01-01 MED ORDER — SCOPOLAMINE 1 MG/3DAYS TD PT72
1.0000 | MEDICATED_PATCH | TRANSDERMAL | Status: DC
  Administered 2020-01-01: 1.5 mg via TRANSDERMAL
  Filled 2020-01-01: qty 1

## 2020-01-01 MED ORDER — MIDAZOLAM HCL 2 MG/2ML IJ SOLN
INTRAMUSCULAR | Status: AC
  Filled 2020-01-01: qty 2

## 2020-01-01 MED ORDER — PROMETHAZINE HCL 25 MG/ML IJ SOLN: 12.5000 mg | Freq: Four times a day (QID) | INTRAMUSCULAR | Status: DC | PRN

## 2020-01-01 MED ORDER — PROPOFOL 10 MG/ML IV BOLUS
INTRAVENOUS | Status: DC | PRN
  Administered 2020-01-01: 200 mg via INTRAVENOUS

## 2020-01-01 MED ORDER — PROMETHAZINE HCL 25 MG/ML IJ SOLN: 6.2500 mg | INTRAMUSCULAR | Status: DC | PRN

## 2020-01-01 MED ORDER — OXYCODONE HCL 5 MG/5ML PO SOLN: 5.0000 mg | Freq: Once | ORAL | Status: DC | PRN

## 2020-01-01 MED ORDER — PHENYLEPHRINE 40 MCG/ML (10ML) SYRINGE FOR IV PUSH (FOR BLOOD PRESSURE SUPPORT)
PREFILLED_SYRINGE | INTRAVENOUS | Status: AC
  Filled 2020-01-01: qty 20

## 2020-01-01 MED ORDER — ORAL CARE MOUTH RINSE: 15.0000 mL | Freq: Once | OROMUCOSAL | Status: AC

## 2020-01-01 MED ORDER — PROPOFOL 10 MG/ML IV BOLUS
INTRAVENOUS | Status: AC
  Filled 2020-01-01: qty 20

## 2020-01-01 MED ORDER — ROCURONIUM BROMIDE 10 MG/ML (PF) SYRINGE
PREFILLED_SYRINGE | INTRAVENOUS | Status: DC | PRN
  Administered 2020-01-01: 70 mg via INTRAVENOUS
  Administered 2020-01-01: 15 mg via INTRAVENOUS

## 2020-01-01 MED ORDER — ROCURONIUM BROMIDE 10 MG/ML (PF) SYRINGE
PREFILLED_SYRINGE | INTRAVENOUS | Status: AC
  Filled 2020-01-01: qty 20

## 2020-01-01 MED ORDER — SODIUM CHLORIDE (PF) 0.9 % IJ SOLN
INTRAMUSCULAR | Status: AC
  Filled 2020-01-01: qty 50

## 2020-01-01 MED ORDER — MIDAZOLAM HCL 5 MG/5ML IJ SOLN
INTRAMUSCULAR | Status: DC | PRN
  Administered 2020-01-01: 2 mg via INTRAVENOUS

## 2020-01-01 MED ORDER — ENOXAPARIN SODIUM 30 MG/0.3ML ~~LOC~~ SOLN
30.0000 mg | Freq: Two times a day (BID) | SUBCUTANEOUS | Status: DC
  Administered 2020-01-01: 17:00:00 30 mg via SUBCUTANEOUS
  Filled 2020-01-01: qty 0.3

## 2020-01-01 MED ORDER — KCL IN DEXTROSE-NACL 20-5-0.45 MEQ/L-%-% IV SOLN
INTRAVENOUS | Status: DC
  Filled 2020-01-01 (×4): qty 1000

## 2020-01-01 MED ORDER — OXYCODONE HCL 5 MG/5ML PO SOLN
5.0000 mg | Freq: Four times a day (QID) | ORAL | Status: DC | PRN
  Administered 2020-01-02: 01:00:00 5 mg via ORAL
  Filled 2020-01-01: qty 5

## 2020-01-01 MED ORDER — ACETAMINOPHEN 500 MG PO TABS
1000.0000 mg | ORAL_TABLET | Freq: Three times a day (TID) | ORAL | Status: DC
  Administered 2020-01-01 – 2020-01-02 (×3): 1000 mg via ORAL
  Filled 2020-01-01 (×3): qty 2

## 2020-01-01 MED ORDER — SUCCINYLCHOLINE CHLORIDE 200 MG/10ML IV SOSY
PREFILLED_SYRINGE | INTRAVENOUS | Status: DC | PRN
  Administered 2020-01-01: 180 mg via INTRAVENOUS

## 2020-01-01 MED ORDER — DEXAMETHASONE SODIUM PHOSPHATE 10 MG/ML IJ SOLN
INTRAMUSCULAR | Status: DC | PRN
  Administered 2020-01-01: 10 mg via INTRAVENOUS

## 2020-01-01 MED ORDER — ONDANSETRON HCL 4 MG/2ML IJ SOLN
4.0000 mg | Freq: Four times a day (QID) | INTRAMUSCULAR | Status: DC | PRN
  Administered 2020-01-02: 4 mg via INTRAVENOUS
  Filled 2020-01-01: qty 2

## 2020-01-01 MED ORDER — HEPARIN SODIUM (PORCINE) 5000 UNIT/ML IJ SOLN
5000.0000 [IU] | INTRAMUSCULAR | Status: AC
  Administered 2020-01-01: 5000 [IU] via SUBCUTANEOUS
  Filled 2020-01-01: qty 1

## 2020-01-01 MED ORDER — FENTANYL CITRATE (PF) 100 MCG/2ML IJ SOLN
INTRAMUSCULAR | Status: DC | PRN
  Administered 2020-01-01: 100 ug via INTRAVENOUS

## 2020-01-01 MED ORDER — SUGAMMADEX SODIUM 500 MG/5ML IV SOLN
INTRAVENOUS | Status: AC
  Filled 2020-01-01: qty 5

## 2020-01-01 MED ORDER — PROPOFOL 10 MG/ML IV BOLUS
INTRAVENOUS | Status: AC
  Filled 2020-01-01: qty 40

## 2020-01-01 MED ORDER — OXYCODONE HCL 5 MG PO TABS: 5.0000 mg | ORAL_TABLET | Freq: Once | ORAL | Status: DC | PRN

## 2020-01-01 MED ORDER — ACETAMINOPHEN 500 MG PO TABS
1000.0000 mg | ORAL_TABLET | ORAL | Status: AC
  Administered 2020-01-01: 1000 mg via ORAL
  Filled 2020-01-01: qty 2

## 2020-01-01 MED ORDER — ACETAMINOPHEN 160 MG/5ML PO SOLN: 1000.0000 mg | Freq: Three times a day (TID) | ORAL | Status: DC

## 2020-01-01 MED ORDER — ONDANSETRON HCL 4 MG/2ML IJ SOLN
INTRAMUSCULAR | Status: AC
  Filled 2020-01-01: qty 4

## 2020-01-01 MED ORDER — ONDANSETRON HCL 4 MG/2ML IJ SOLN
INTRAMUSCULAR | Status: DC | PRN
  Administered 2020-01-01: 4 mg via INTRAVENOUS

## 2020-01-01 MED ORDER — LACTATED RINGERS IR SOLN
Status: DC | PRN
  Administered 2020-01-01: 1000 mL

## 2020-01-01 MED ORDER — HYDROMORPHONE HCL 1 MG/ML IJ SOLN
INTRAMUSCULAR | Status: AC
  Filled 2020-01-01: qty 1

## 2020-01-01 MED ORDER — SODIUM CHLORIDE 0.9 % IV SOLN
2.0000 g | INTRAVENOUS | Status: AC
  Administered 2020-01-01: 2 g via INTRAVENOUS
  Filled 2020-01-01: qty 2

## 2020-01-01 MED ORDER — GABAPENTIN 100 MG PO CAPS
200.0000 mg | ORAL_CAPSULE | Freq: Two times a day (BID) | ORAL | Status: DC
  Administered 2020-01-01 – 2020-01-02 (×3): 200 mg via ORAL
  Filled 2020-01-01 (×3): qty 2

## 2020-01-01 MED ORDER — LIDOCAINE 2% (20 MG/ML) 5 ML SYRINGE
INTRAMUSCULAR | Status: AC
  Filled 2020-01-01: qty 10

## 2020-01-01 MED ORDER — KETAMINE HCL 10 MG/ML IJ SOLN
INTRAMUSCULAR | Status: DC | PRN
  Administered 2020-01-01: 30 mg via INTRAVENOUS

## 2020-01-01 MED ORDER — PANTOPRAZOLE SODIUM 40 MG IV SOLR
40.0000 mg | Freq: Every day | INTRAVENOUS | Status: DC
  Administered 2020-01-01: 22:00:00 40 mg via INTRAVENOUS
  Filled 2020-01-01: qty 40

## 2020-01-01 MED ORDER — LIDOCAINE 2% (20 MG/ML) 5 ML SYRINGE
INTRAMUSCULAR | Status: DC | PRN
  Administered 2020-01-01: 80 mg via INTRAVENOUS

## 2020-01-01 MED ORDER — CHLORHEXIDINE GLUCONATE 0.12 % MT SOLN
15.0000 mL | Freq: Once | OROMUCOSAL | Status: AC
  Administered 2020-01-01: 15 mL via OROMUCOSAL

## 2020-01-01 MED ORDER — SODIUM CHLORIDE (PF) 0.9 % IJ SOLN
INTRAMUSCULAR | Status: DC | PRN
  Administered 2020-01-01: 50 mL

## 2020-01-01 MED ORDER — LORATADINE 10 MG PO TABS
10.0000 mg | ORAL_TABLET | Freq: Every day | ORAL | Status: DC
  Administered 2020-01-01 – 2020-01-02 (×2): 10 mg via ORAL
  Filled 2020-01-01 (×2): qty 1

## 2020-01-01 MED ORDER — FENTANYL CITRATE (PF) 100 MCG/2ML IJ SOLN
INTRAMUSCULAR | Status: AC
  Filled 2020-01-01: qty 2

## 2020-01-01 MED ORDER — DEXAMETHASONE SODIUM PHOSPHATE 10 MG/ML IJ SOLN
INTRAMUSCULAR | Status: AC
  Filled 2020-01-01: qty 2

## 2020-01-01 MED ORDER — ENSURE MAX PROTEIN PO LIQD
2.0000 [oz_av] | ORAL | Status: DC
  Administered 2020-01-02 (×3): 2 [oz_av] via ORAL

## 2020-01-01 MED ORDER — PHENYLEPHRINE 40 MCG/ML (10ML) SYRINGE FOR IV PUSH (FOR BLOOD PRESSURE SUPPORT)
PREFILLED_SYRINGE | INTRAVENOUS | Status: DC | PRN
  Administered 2020-01-01: 160 ug via INTRAVENOUS
  Administered 2020-01-01 (×7): 80 ug via INTRAVENOUS
  Administered 2020-01-01: 120 ug via INTRAVENOUS
  Administered 2020-01-01 (×3): 80 ug via INTRAVENOUS

## 2020-01-01 MED ORDER — LACTATED RINGERS IV SOLN: INTRAVENOUS | Status: DC

## 2020-01-01 MED ORDER — KETAMINE HCL 10 MG/ML IJ SOLN
INTRAMUSCULAR | Status: AC
  Filled 2020-01-01: qty 1

## 2020-01-01 MED ORDER — SIMETHICONE 80 MG PO CHEW
80.0000 mg | CHEWABLE_TABLET | Freq: Four times a day (QID) | ORAL | Status: DC | PRN
  Administered 2020-01-02: 80 mg via ORAL
  Filled 2020-01-01: qty 1

## 2020-01-01 MED ORDER — MORPHINE SULFATE (PF) 2 MG/ML IV SOLN
1.0000 mg | INTRAVENOUS | Status: DC | PRN
  Administered 2020-01-01 – 2020-01-02 (×2): 2 mg via INTRAVENOUS
  Filled 2020-01-01 (×2): qty 1

## 2020-01-01 MED ORDER — SODIUM CHLORIDE (PF) 0.9 % IJ SOLN
INTRAMUSCULAR | Status: AC
  Filled 2020-01-01: qty 10

## 2020-01-01 MED ORDER — PHENYLEPHRINE 40 MCG/ML (10ML) SYRINGE FOR IV PUSH (FOR BLOOD PRESSURE SUPPORT)
PREFILLED_SYRINGE | INTRAVENOUS | Status: AC
  Filled 2020-01-01: qty 10

## 2020-01-01 MED ORDER — 0.9 % SODIUM CHLORIDE (POUR BTL) OPTIME
TOPICAL | Status: DC | PRN
  Administered 2020-01-01: 1000 mL

## 2020-01-01 SURGICAL SUPPLY — 84 items
APL PRP STRL LF DISP 70% ISPRP (MISCELLANEOUS) ×4
APL SKNCLS STERI-STRIP NONHPOA (GAUZE/BANDAGES/DRESSINGS) ×2
APL SRG 32X5 SNPLK LF DISP (MISCELLANEOUS)
APL SWBSTK 6 STRL LF DISP (MISCELLANEOUS)
APPLICATOR COTTON TIP 6 STRL (MISCELLANEOUS) IMPLANT
APPLICATOR COTTON TIP 6IN STRL (MISCELLANEOUS)
APPLIER CLIP ROT 10 11.4 M/L (STAPLE)
APPLIER CLIP ROT 13.4 12 LRG (CLIP)
APR CLP LRG 13.4X12 ROT 20 MLT (CLIP)
APR CLP MED LRG 11.4X10 (STAPLE)
BENZOIN TINCTURE PRP APPL 2/3 (GAUZE/BANDAGES/DRESSINGS) ×4 IMPLANT
BLADE SURG SZ11 CARB STEEL (BLADE) ×4 IMPLANT
BNDG ADH 1X3 SHEER STRL LF (GAUZE/BANDAGES/DRESSINGS) ×6 IMPLANT
BNDG ADH THN 3X1 STRL LF (GAUZE/BANDAGES/DRESSINGS)
CABLE HIGH FREQUENCY MONO STRZ (ELECTRODE) ×4 IMPLANT
CHLORAPREP W/TINT 26 (MISCELLANEOUS) ×8 IMPLANT
CLIP APPLIE ROT 10 11.4 M/L (STAPLE) IMPLANT
CLIP APPLIE ROT 13.4 12 LRG (CLIP) IMPLANT
CLOSURE WOUND 1/2 X4 (GAUZE/BANDAGES/DRESSINGS) ×1
COVER SURGICAL LIGHT HANDLE (MISCELLANEOUS) ×4 IMPLANT
COVER WAND RF STERILE (DRAPES) IMPLANT
DECANTER SPIKE VIAL GLASS SM (MISCELLANEOUS) ×4 IMPLANT
DEVICE SUT QUICK LOAD TK 5 (STAPLE) IMPLANT
DEVICE SUT TI-KNOT TK 5X26 (MISCELLANEOUS) IMPLANT
DEVICE SUTURE ENDOST 10MM (ENDOMECHANICALS) IMPLANT
DEVICE TI KNOT TK5 (MISCELLANEOUS)
DISSECTOR BLUNT TIP ENDO 5MM (MISCELLANEOUS) IMPLANT
DRAPE UTILITY XL STRL (DRAPES) ×8 IMPLANT
ELECT L-HOOK LAP 45CM DISP (ELECTROSURGICAL)
ELECT REM PT RETURN 15FT ADLT (MISCELLANEOUS) ×4 IMPLANT
ELECTRODE L-HOOK LAP 45CM DISP (ELECTROSURGICAL) IMPLANT
GAUZE SPONGE 2X2 8PLY STRL LF (GAUZE/BANDAGES/DRESSINGS) IMPLANT
GAUZE SPONGE 4X4 12PLY STRL (GAUZE/BANDAGES/DRESSINGS) IMPLANT
GLOVE BIO SURGEON STRL SZ7.5 (GLOVE) ×4 IMPLANT
GLOVE INDICATOR 8.0 STRL GRN (GLOVE) ×4 IMPLANT
GOWN STRL REUS W/TWL XL LVL3 (GOWN DISPOSABLE) ×15 IMPLANT
GRASPER SUT TROCAR 14GX15 (MISCELLANEOUS) ×4 IMPLANT
KIT BASIN OR (CUSTOM PROCEDURE TRAY) ×4 IMPLANT
KIT TURNOVER KIT A (KITS) ×3 IMPLANT
MARKER SKIN DUAL TIP RULER LAB (MISCELLANEOUS) ×4 IMPLANT
MAT PREVALON FULL STRYKER (MISCELLANEOUS) ×4 IMPLANT
NDL SPNL 22GX3.5 QUINCKE BK (NEEDLE) ×1 IMPLANT
NEEDLE SPNL 22GX3.5 QUINCKE BK (NEEDLE) ×4 IMPLANT
PACK UNIVERSAL I (CUSTOM PROCEDURE TRAY) ×4 IMPLANT
PENCIL SMOKE EVACUATOR (MISCELLANEOUS) IMPLANT
QUICK LOAD TK 5 (STAPLE)
RELOAD STAPLE 60 3.6 BLU REG (STAPLE) ×1 IMPLANT
RELOAD STAPLE 60 3.8 GOLD REG (STAPLE) IMPLANT
RELOAD STAPLE 60 4.1 GRN THCK (STAPLE) ×1 IMPLANT
RELOAD STAPLE 60 BLK VRY/THCK (STAPLE) IMPLANT
RELOAD STAPLER 60MM BLK (STAPLE) IMPLANT
RELOAD STAPLER BLUE 60MM (STAPLE) ×12 IMPLANT
RELOAD STAPLER GOLD 60MM (STAPLE) IMPLANT
RELOAD STAPLER GREEN 60MM (STAPLE) ×2 IMPLANT
SCISSORS LAP 5X45 EPIX DISP (ENDOMECHANICALS) ×3 IMPLANT
SEALANT SURGICAL APPL DUAL CAN (MISCELLANEOUS) IMPLANT
SET IRRIG TUBING LAPAROSCOPIC (IRRIGATION / IRRIGATOR) ×4 IMPLANT
SET TUBE SMOKE EVAC HIGH FLOW (TUBING) ×4 IMPLANT
SHEARS HARMONIC ACE PLUS 45CM (MISCELLANEOUS) ×4 IMPLANT
SLEEVE GASTRECTOMY 40FR VISIGI (MISCELLANEOUS) ×4 IMPLANT
SLEEVE XCEL OPT CAN 5 100 (ENDOMECHANICALS) ×12 IMPLANT
SOL ANTI FOG 6CC (MISCELLANEOUS) ×2 IMPLANT
SOLUTION ANTI FOG 6CC (MISCELLANEOUS) ×2
SPONGE GAUZE 2X2 STER 10/PKG (GAUZE/BANDAGES/DRESSINGS)
SPONGE LAP 18X18 RF (DISPOSABLE) ×4 IMPLANT
STAPLER ECHELON BIOABSB 60 FLE (MISCELLANEOUS) ×16 IMPLANT
STAPLER ECHELON LONG 60 440 (INSTRUMENTS) ×4 IMPLANT
STAPLER RELOAD 60MM BLK (STAPLE)
STAPLER RELOAD BLUE 60MM (STAPLE) ×24
STAPLER RELOAD GOLD 60MM (STAPLE)
STAPLER RELOAD GREEN 60MM (STAPLE) ×4
STRIP CLOSURE SKIN 1/2X4 (GAUZE/BANDAGES/DRESSINGS) ×3 IMPLANT
SUT MNCRL AB 4-0 PS2 18 (SUTURE) ×6 IMPLANT
SUT SURGIDAC NAB ES-9 0 48 120 (SUTURE) IMPLANT
SUT VICRYL 0 TIES 12 18 (SUTURE) ×4 IMPLANT
SYR 20ML LL LF (SYRINGE) ×4 IMPLANT
SYR 50ML LL SCALE MARK (SYRINGE) ×4 IMPLANT
TOWEL OR 17X26 10 PK STRL BLUE (TOWEL DISPOSABLE) ×4 IMPLANT
TOWEL OR NON WOVEN STRL DISP B (DISPOSABLE) ×4 IMPLANT
TROCAR BLADELESS 15MM (ENDOMECHANICALS) ×4 IMPLANT
TROCAR BLADELESS OPT 5 100 (ENDOMECHANICALS) ×4 IMPLANT
TUBING CONNECTING 10 (TUBING) ×6 IMPLANT
TUBING CONNECTING 10' (TUBING) ×2
TUBING ENDO SMARTCAP (MISCELLANEOUS) ×4 IMPLANT

## 2020-01-01 NOTE — Progress Notes (Signed)
Discussed post op day goals with patient including ambulation, IS, diet progression, pain, and nausea control.  BSTOP education provided including BSTOP information guide, "Guide for Pain Management after your Bariatric Procedure".  Questions answered. 

## 2020-01-01 NOTE — Plan of Care (Signed)
Pt arrived to unit at 1030, walked to chair , patient very lethargic at present so IS will be explained at later time. Patient has no complaints of pain or nausea upon arrival to the unit. Pt will be allowed to have water at 1130.

## 2020-01-01 NOTE — Anesthesia Procedure Notes (Signed)
Procedure Name: Intubation Date/Time: 01/01/2020 7:49 AM Performed by: Lavina Hamman, CRNA Pre-anesthesia Checklist: Patient identified, Emergency Drugs available, Suction available, Patient being monitored and Timeout performed Patient Re-evaluated:Patient Re-evaluated prior to induction Oxygen Delivery Method: Circle system utilized Preoxygenation: Pre-oxygenation with 100% oxygen Induction Type: IV induction Ventilation: Mask ventilation without difficulty Laryngoscope Size: Mac and 3 Grade View: Grade I Tube type: Oral Tube size: 7.5 mm Number of attempts: 1 Airway Equipment and Method: Stylet Placement Confirmation: ETT inserted through vocal cords under direct vision,  positive ETCO2,  CO2 detector and breath sounds checked- equal and bilateral Secured at: 21 cm Tube secured with: Tape Dental Injury: Teeth and Oropharynx as per pre-operative assessment  Comments: ATOI, multiple loose brackets for braces, none changed or lost upon intubation.

## 2020-01-01 NOTE — Discharge Instructions (Signed)
° ° ° °GASTRIC BYPASS/SLEEVE ° Home Care Instructions ° ° These instructions are to help you care for yourself when you go home. ° °Call: If you have any problems. °• Call [phone] and ask for the surgeon on call °• If you need immediate help, come to the ER at [facility].  °• Tell the ER staff that you are a new post-op gastric bypass or gastric sleeve patient °  °Signs and symptoms to report: • Severe vomiting or nausea °o If you cannot keep down clear liquids for longer than 1 day, call your surgeon  °• Abdominal pain that does not get better after taking your pain medication °• Fever over 100.4° F with chills °• Heart beating over 100 beats a minute °• Shortness of breath at rest °• Chest pain °•  Redness, swelling, drainage, or foul odor at incision (surgical) sites °•  If your incisions open or pull apart °• Swelling or pain in calf (lower leg) °• Diarrhea (Loose bowel movements that happen often), frequent watery, uncontrolled bowel movements °• Constipation, (no bowel movements for 3 days) if this happens: Pick one °o Milk of Magnesia, 2 tablespoons by mouth, 3 times a day for 2 days if needed °o Stop taking Milk of Magnesia once you have a bowel movement °o Call your doctor if constipation continues °Or °o Miralax  (instead of Milk of Magnesia) following the label instructions °o Stop taking Miralax once you have a bowel movement °o Call your doctor if constipation continues °• Anything you think is not normal °  °Normal side effects after surgery: • Unable to sleep at night or unable to focus °• Irritability or moody °• Being tearful (crying) or depressed °These are common complaints, possibly related to your anesthesia medications that put you to sleep, stress of surgery, and change in lifestyle.  This usually goes away a few weeks after surgery.  If these feelings continue, call your primary care doctor. °  °Wound Care: You may have surgical glue, steri-strips, or staples over your incisions after  surgery °• Surgical glue:  Looks like a clear film over your incisions and will wear off a little at a time °• Steri-strips: Strips of tape over your incisions. You may notice a yellowish color on the skin under the steri-strips. This is used to make the   steri-strips stick better. Do not pull the steri-strips off - let them fall off °• Staples: Staples may be removed before you leave the hospital °o If you go home with staples, call [facility], [phone] at for an appointment with your surgeon’s nurse to have staples removed 10 days after surgery. °• Showering: You may shower two (2) days after your surgery unless your surgeon tells you differently °o Wash gently around incisions with warm soapy water, rinse well, and gently pat dry  °o No tub baths until staples are removed, steri-strips fall off or glue is gone.  °  °Medications: • Medications should be liquid or crushed if larger than the size of a dime °• Extended release pills (medication that release a little bit at a time through the day) should NOT be crushed or cut. (examples include XL, ER, DR, SR) °• Depending on the size and number of medications you take, you may need to space (take a few throughout the day)/change the time you take your medications so that you do not over-fill your pouch (smaller stomach) °• Make sure you follow-up with your primary care doctor to   make medication changes needed during rapid weight loss and life-style changes °• If you have diabetes, follow up with the doctor that orders your diabetes medication(s) within one week after surgery and check your blood sugar regularly. °• Do not drive while taking prescription pain medication  °• It is ok to take Tylenol by the bottle instructions with your pain medicine or instead of your pain medicine as needed.  DO NOT TAKE NSAIDS (EXAMPLES OF NSAIDS:  IBUPROFREN/ NAPROXEN)  °Diet:                    First 2 Weeks ° You will see the dietician t about two (2) weeks  after your surgery. The dietician will increase the types of foods you can eat if you are handling liquids well: °• If you have severe vomiting or nausea and cannot keep down clear liquids lasting longer than 1 day, call your surgeon @ ([phone]) °Protein Shake °• Drink at least 2 ounces of shake 5-6 times per day °• Each serving of protein shakes (usually 8 - 12 ounces) should have: °o 15 grams of protein  °o And no more than 5 grams of carbohydrate  °• Goal for protein each day: °o Men = 80 grams per day °o Women = 60 grams per day °• Protein powder may be added to fluids such as non-fat milk or Lactaid milk or unsweetened Soy/Almond milk (limit to 35 grams added protein powder per serving) ° °Hydration °• Slowly increase the amount of water and other clear liquids as tolerated (See Acceptable Fluids) °• Slowly increase the amount of protein shake as tolerated  °•  Sip fluids slowly and throughout the day.  Do not use straws. °• May use sugar substitutes in small amounts (no more than 6 - 8 packets per day; i.e. Splenda) ° °Fluid Goal °• The first goal is to drink at least 8 ounces of protein shake/drink per day (or as directed by the nutritionist); some examples of protein shakes are Syntrax Nectar, Adkins Advantage, EAS Edge HP, and Unjury. See handout from pre-op Bariatric Education Class: °o Slowly increase the amount of protein shake you drink as tolerated °o You may find it easier to slowly sip shakes throughout the day °o It is important to get your proteins in first °• Your fluid goal is to drink 64 - 100 ounces of fluid daily °o It may take a few weeks to build up to this °• 32 oz (or more) should be clear liquids  °And  °• 32 oz (or more) should be full liquids (see below for examples) °• Liquids should not contain sugar, caffeine, or carbonation ° °Clear Liquids: °• Water or Sugar-free flavored water (i.e. Fruit H2O, Propel) °• Decaffeinated coffee or tea (sugar-free) °• Crystal Lite, Wyler’s Lite,  Minute Maid Lite °• Sugar-free Jell-O °• Bouillon or broth °• Sugar-free Popsicle:   *Less than 20 calories each; Limit 1 per day ° °Full Liquids: °Protein Shakes/Drinks + 2 choices per day of other full liquids °• Full liquids must be: °o No More Than 15 grams of Carbs per serving  °o No More Than 3 grams of Fat per serving °• Strained low-fat cream soup (except Cream of Potato or Tomato) °• Non-Fat milk °• Fat-free Lactaid Milk °• Unsweetened Soy Or Unsweetened Almond Milk °• Low Sugar yogurt (Dannon Lite & Fit, Greek yogurt; Oikos Triple Zero; Chobani Simply 100; Yoplait 100 calorie Greek - No Fruit on the Bottom) ° °  °Vitamins   and Minerals • Start 1 day after surgery unless otherwise directed by your surgeon °• Chewable Bariatric Specific Multivitamin / Multimineral Supplement with iron (Example: Bariatric Advantage Multi EA) °• Chewable Calcium with Vitamin D-3 °(Example: 3 Chewable Calcium Plus 600 with Vitamin D-3) °o Take 500 mg three (3) times a day for a total of 1500 mg each day °o Do not take all 3 doses of calcium at one time as it may cause constipation, and you can only absorb 500 mg  at a time  °o Do not mix multivitamins containing iron with calcium supplements; take 2 hours apart °• Menstruating women and those with a history of anemia (a blood disease that causes weakness) may need extra iron °o Talk with your doctor to see if you need more iron °• Do not stop taking or change any vitamins or minerals until you talk to your dietitian or surgeon °• Your Dietitian and/or surgeon must approve all vitamin and mineral supplements °  °Activity and Exercise: Limit your physical activity as instructed by your doctor.  It is important to continue walking at home.  During this time, use these guidelines: °• Do not lift anything greater than ten (10) pounds for at least two (2) weeks °• Do not go back to work or drive until your surgeon says you can °• You may have sex when you feel comfortable  °o It is  VERY important for female patients to use a reliable birth control method; fertility often increases after surgery  °o All hormonal birth control will be ineffective for 30 days after surgery due to medications given during surgery a barrier method must be used. °o Do not get pregnant for at least 18 months °• Start exercising as soon as your doctor tells you that you can °o Make sure your doctor approves any physical activity °• Start with a simple walking program °• Walk 5-15 minutes each day, 7 days per week.  °• Slowly increase until you are walking 30-45 minutes per day °Consider joining our BELT program. [phone] or email belt@[employer] °  °Special Instructions Things to remember: °• Use your CPAP when sleeping if this applies to you ° °• [facility] has two free Bariatric Surgery Support Groups that meet monthly °o The 3rd Thursday of each month, 6 pm °• It is very important to keep all follow up appointments with your surgeon, dietitian, primary care physician, and behavioral health practitioner °• Routine follow up schedule with your surgeon include appointments at 2-3 weeks, 6-8 weeks, 6 months, and 1 year at a minimum.  Your surgeon may request to see you more often.   °• After the first year, please follow up with your bariatric surgeon and dietitian at least once a year in order to maintain best weight loss results ° ° °[facility]: [phone] °[facility]: [phone] °Bariatric Nurse Coordinator: [phone] °  °   Reviewed and Endorsed  °by [facility] Patient Education Committee, June, 2016 °Edits Approved: Aug, 2018 ° ° ° °

## 2020-01-01 NOTE — Op Note (Signed)
[name] [id number] [birth year] 01/01/2020  Preoperative diagnosis: morbid obesity-  Sleeve in progress  Postoperative diagnosis: Same   Procedure: Upper endoscopy   Surgeon: Susy Frizzle B. Daphine Deutscher  M.D., FACS   Anesthesia: Gen.   Indications for procedure: This patient was undergoing a sleeve gastrectomy by Dr. Andrey Campanile.  Check for bleeding and leaks.    Description of procedure: The endoscopy was placed in the mouth and into the oropharynx and under endoscopic vision it was advanced to the esophagogastric junction.  The pouch was insufflated and the sleeve was cylindrical and the antrum was appropriately sized.  .   No bleeding or leaks were detected.  The scope was withdrawn without difficulty.     Matt B. Daphine Deutscher, MD, FACS General, Bariatric, & Minimally Invasive Surgery [facility], [place]

## 2020-01-01 NOTE — Interval H&P Note (Signed)
History and Physical Interval Note:  01/01/2020 7:16 AM  [name]  has presented today for surgery, with the diagnosis of MORBID OBESITY.  The various methods of treatment have been discussed with the patient and family. After consideration of risks, benefits and other options for treatment, the patient has consented to  Procedure(s): LAPAROSCOPIC GASTRIC SLEEVE RESECTION (N/A) UPPER GI ENDOSCOPY (N/A) as a surgical intervention.  The patient's history has been reviewed, patient examined, no change in status, stable for surgery.  I have reviewed the patient's chart and labs.  Questions were answered to the patient's satisfaction.     Gaynelle Adu

## 2020-01-01 NOTE — Transfer of Care (Signed)
Immediate Anesthesia Transfer of Care Note  Patient: [name]  Procedure(s) Performed: Procedure(s): LAPAROSCOPIC GASTRIC SLEEVE RESECTION (N/A) UPPER GI ENDOSCOPY (N/A)  Patient Location: PACU  Anesthesia Type:General  Level of Consciousness:  sedated, patient cooperative and responds to stimulation  Airway & Oxygen Therapy:Patient Spontanous Breathing and Patient connected to face mask oxgen  Post-op Assessment:  Report given to PACU RN and Post -op Vital signs reviewed and stable  Post vital signs:  Reviewed and stable  Last Vitals:  Vitals:   01/01/20 0550 01/01/20 0908  BP: (!) 143/78   Pulse: 88   Resp: 16   Temp: 37.3 C (P) 37.1 C  SpO2: 100%     Complications: No apparent anesthesia complications

## 2020-01-01 NOTE — Progress Notes (Signed)
PHARMACY CONSULT FOR:  Risk Assessment for Post-Discharge VTE Following Bariatric Surgery  Post-Discharge VTE Risk Assessment: This patient's probability of 30-day post-discharge VTE is increased due to the factors marked:   Female    Age >/=60 years  X  BMI >/=50 kg/m2    CHF    Dyspnea at Rest    Paraplegia  X  Non-gastric-band surgery    Operation Time >/=3 hr    Return to OR     Length of Stay >/= 3 d   Hx of VTE   Hypercoagulable condition   Significant venous stasis     Predicted probability of 30-day post-discharge VTE: 0.27%   Recommendation for Discharge: No pharmacologic prophylaxis post-discharge    [name] is a 24 y.o. female who underwent lap sleeve gastrectomy on 01/01/20.   Case start: 0756 Case end: 0900   Allergies  Allergen Reactions  . Contrast Media [Iodinated Diagnostic Agents] Anaphylaxis  . Isovue [Iopamidol]     Patient began sneezing, then c/o her throat closing up, SOB, wheezing    Patient Measurements: Height: 5\' 2"  (157.5 cm) Weight: 127.5 kg (281 lb) IBW/kg (Calculated) : 50.1 Body mass index is 51.4 kg/m.  Recent Labs    01/01/20 0916  HGB 12.3  HCT 39.2   Estimated Creatinine Clearance: 140 mL/min (by C-G formula based on SCr of 0.7 mg/dL).    Past Medical History:  Diagnosis Date  . Anemia    iron  . Asthma    spring  . Back pain   . Joint pain   . Lower extremity edema   . PCOS (polycystic ovarian syndrome)   . Pre-diabetes   . Prediabetes   . Seasonal allergies   . SOB (shortness of breath)      Medications Prior to Admission  Medication Sig Dispense Refill Last Dose  . levonorgestrel-ethinyl estradiol (VIENVA) 0.1-20 MG-MCG tablet Take 1 tablet by mouth daily.   Past Month at Unknown time  . Vitamin D, Ergocalciferol, (DRISDOL) 1.25 MG (50000 UNIT) CAPS capsule TAKE 1 CAPSULE (50,000 UNITS TOTAL) BY MOUTH EVERY 7 (SEVEN) DAYS. 4 capsule 0 Past Month at Unknown time       [name]  P 01/01/2020,10:45 AM

## 2020-01-01 NOTE — Anesthesia Postprocedure Evaluation (Signed)
Anesthesia Post Note  Patient: [name]  Procedure(s) Performed: LAPAROSCOPIC GASTRIC SLEEVE RESECTION (N/A Abdomen) UPPER GI ENDOSCOPY (N/A )     Patient location during evaluation: PACU Anesthesia Type: General Level of consciousness: awake Pain management: pain level controlled Vital Signs Assessment: post-procedure vital signs reviewed and stable Respiratory status: spontaneous breathing and respiratory function stable Cardiovascular status: stable Postop Assessment: no apparent nausea or vomiting Anesthetic complications: no   No complications documented.  Last Vitals:  Vitals:   01/01/20 1000 01/01/20 1023  BP: (!) 144/81 137/84  Pulse: 94 84  Resp: 19 18  Temp: 37.2 C 36.7 C  SpO2: 100% 100%    Last Pain:  Vitals:   01/01/20 1023  TempSrc: Oral  PainSc:                  Mellody Dance

## 2020-01-01 NOTE — Op Note (Signed)
01/01/2020 TONEISHA SAVARY 09/19/1995 [id number]   PRE-OPERATIVE DIAGNOSIS:     PCOS (polycystic ovarian syndrome)   Severe obesity BMI 51   Prediabetes   Mild obstructive sleep apnea  POST-OPERATIVE DIAGNOSIS:  same  PROCEDURE:  Procedure(s): LAPAROSCOPIC SLEEVE GASTRECTOMY  UPPER GI ENDOSCOPY  SURGEON:  Surgeon(s): Atilano Ina, MD FACS FASMBS  ASSISTANTS: Wenda Low MD FACS  ANESTHESIA:   general  DRAINS: none   BOUGIE: 40 fr ViSiGi  LOCAL MEDICATIONS USED:   Exparel  EBL: minimal  SPECIMEN:  Source of Specimen:  Greater curvature of stomach  DISPOSITION OF SPECIMEN:  PATHOLOGY  COUNTS:  YES  INDICATION FOR PROCEDURE: This is a very pleasant 24 y.o.-year-old morbidly obese female who has had unsuccessful attempts for sustained weight loss. The patient presents today for a planned laparoscopic sleeve gastrectomy with upper endoscopy. We have discussed the risk and benefits of the procedure extensively preoperatively. Please see my separate notes.  PROCEDURE: After obtaining informed consent and receiving 5000 units of subcutaneous heparin, the patient was brought to the operating room at [facility] and placed supine on the operating room table. General endotracheal anesthesia was established. Sequential compression devices were placed. A orogastric tube was placed. The patient's abdomen was prepped and draped in the usual standard surgical fashion. The patient received preoperative IV antibiotic. A surgical timeout was performed. ERAS protocol used.   Access to the abdomen was achieved using a 5 mm 0 laparoscope thru a 5 mm trocar In the left upper Quadrant 2 fingerbreadths below the left subcostal margin using the Optiview technique. Pneumoperitoneum was smoothly established up to 15 mm of mercury. The laparoscope was advanced and the abdominal cavity was surveilled. The patient was then placed in reverse Trendelenburg.   A 5 mm trocar was placed slightly  above and to the left of the umbilicus under direct visualization.  The [facility] liver retractor was placed under the left lobe of the liver through a 5 mm trocar incision site in the subxiphoid position. A 5 mm trocar was placed in the lateral right upper quadrant along with a 15 mm trocar in the mid right abdomen. A final 5 mm trocar was placed in the lateral LUQ.  All under direct visualization after exparel had been infiltrated in bilateral lateral upper abdominal walls as a TAP block.  The stomach was inspected. It was completely decompressed and the orogastric tube was removed.  There was no anterior dimple that was obviously visible. Her preop ugi showed no hiatal hernia.  We identified the pylorus and measured 6 cm proximal to the pylorus and identified an area of where we would start taking down the short gastric vessels. Harmonic scalpel was used to take down the short gastric vessels along the greater curvature of the stomach. We were able to enter the lesser sac. We continued to march along the greater curvature of the stomach taking down the short gastrics. As we approached the gastrosplenic ligament we took care in this area not to injure the spleen. We were able to take down the entire gastrosplenic ligament. We then mobilized the fundus away from the left crus of diaphragm. There were not any significant posterior gastric avascular attachments. This left the stomach completely mobilized. No vessels had been taken down along the lesser curvature of the stomach.  We then reidentified the pylorus. A 40Fr ViSiGi was then placed in the oropharynx and advanced down into the stomach and placed in the distal antrum and positioned along  the lesser curvature. It was placed under suction which secured the 40Fr ViSiGi in place along the lesser curve. Then using the [employer] echelon 60 mm stapler with a green load with Seamguard, I placed a stapler along the antrum approximately 5 cm from the pylorus. The  stapler was angled so that there is ample room at the angularis incisura. I then fired the first staple load after inspecting it posteriorly to ensure adequate space both anteriorly and posteriorly. At this point I still was not completely past the angularis so with a blue load with Seamguard, I placed the stapler in position just inside the prior stapleline. We then rotated the stomach to insure that there was adequate anteriorly as well as posteriorly. The stapler was then fired.  At this point I started continued using 60 mm blue load staple cartridges with Seamguard. The echelon stapler was then repositioned with a 60 mm blue load with Seamguard and we continued to march up along the ViSiGi. My assistant was holding traction along the greater curvature stomach along the cauterized short gastric vessels ensuring that the stomach was symmetrically retracted. Prior to each firing of the staple, we rotated the stomach to ensure that there is adequate stomach left.  As we approached the fundus, I used 60 mm blue cartridge with Seamguard aiming  lateral to the GE junction after mobilizing some of the esophageal fat pad.  The sleeve was inspected. There is no evidence of cork screw. The staple line appeared hemostatic. The CRNA inflated the ViSiGi to the green zone and the upper abdomen was flooded with saline. There were no bubbles. The sleeve was decompressed and the ViSiGi removed. My assistant scrubbed out and performed an upper endoscopy. The sleeve easily distended with air and the scope was easily advanced to the pylorus. There is no evidence of internal bleeding or cork screwing. There was no narrowing at the angularis. There is no evidence of bubbles. Please see his operative note for further details. The gastric sleeve was decompressed and the endoscope was removed.  The greater curvature the stomach was grasped with a laparoscopic grasper and removed from the 15 mm trocar site.  The liver retractor was  removed. I then closed the 15 mm trocar site with 1 interrupted 0 Vicryl sutures through the fascia using the endoclose. The closure was viewed laparoscopically and it was airtight. Remaining Exparel was then infiltrated in the preperitoneal spaces around the trocar sites. Pneumoperitoneum was released. All trocar sites were closed with a 4-0 Monocryl in a subcuticular fashion followed by the application of benzoin, steri-strips, and bandaids. The patient was extubated and taken to the recovery room in stable condition. All needle, instrument, and sponge counts were correct x2. There are no immediate complications  (1) 60 mm green with Seamguard (6) 60 mm blue with 5 seamguard  PLAN OF CARE: Admit to inpatient   PATIENT DISPOSITION:  PACU - hemodynamically stable.   Delay start of Pharmacological VTE agent (>24hrs) due to surgical blood loss or risk of bleeding:  no  Mary Sella. Andrey Campanile, MD, FACS FASMBS General, Bariatric, & Minimally Invasive Surgery [facility], [place]

## 2020-01-02 ENCOUNTER — Encounter (HOSPITAL_COMMUNITY): Payer: Self-pay | Admitting: General Surgery

## 2020-01-02 ENCOUNTER — Other Ambulatory Visit (HOSPITAL_COMMUNITY): Payer: Self-pay | Admitting: General Surgery

## 2020-01-02 LAB — COMPREHENSIVE METABOLIC PANEL
ALT: 23 U/L (ref 0–44)
AST: 19 U/L (ref 15–41)
Albumin: 3.7 g/dL (ref 3.5–5.0)
Alkaline Phosphatase: 67 U/L (ref 38–126)
Anion gap: 7 (ref 5–15)
BUN: <5 mg/dL — ABNORMAL LOW (ref 6–20)
CO2: 24 mmol/L (ref 22–32)
Calcium: 8.7 mg/dL — ABNORMAL LOW (ref 8.9–10.3)
Chloride: 104 mmol/L (ref 98–111)
Creatinine, Ser: 0.57 mg/dL (ref 0.44–1.00)
GFR, Estimated: >60 mL/min (ref >60)
Glucose, Bld: 116 mg/dL — ABNORMAL HIGH (ref 70–99)
Potassium: 3.6 mmol/L (ref 3.5–5.1)
Sodium: 135 mmol/L (ref 135–145)
Total Bilirubin: 0.6 mg/dL (ref 0.3–1.2)
Total Protein: 7.2 g/dL (ref 6.5–8.1)

## 2020-01-02 LAB — CBC WITH DIFFERENTIAL/PLATELET
Abs Immature Granulocytes: 0.04 10*3/uL (ref 0.00–0.07)
Basophils Absolute: 0 10*3/uL (ref 0.0–0.1)
Basophils Relative: 0 %
Eosinophils Absolute: 0 10*3/uL (ref 0.0–0.5)
Eosinophils Relative: 0 %
HCT: 37.1 % (ref 36.0–46.0)
Hemoglobin: 11.7 g/dL — ABNORMAL LOW (ref 12.0–15.0)
Immature Granulocytes: 0 %
Lymphocytes Relative: 20 %
Lymphs Abs: 2.2 10*3/uL (ref 0.7–4.0)
MCH: 24.5 pg — ABNORMAL LOW (ref 26.0–34.0)
MCHC: 31.5 g/dL (ref 30.0–36.0)
MCV: 77.8 fL — ABNORMAL LOW (ref 80.0–100.0)
Monocytes Absolute: 1.3 10*3/uL — ABNORMAL HIGH (ref 0.1–1.0)
Monocytes Relative: 12 %
Neutro Abs: 7.1 10*3/uL (ref 1.7–7.7)
Neutrophils Relative %: 68 %
Platelets: 345 10*3/uL (ref 150–400)
RBC: 4.77 MIL/uL (ref 3.87–5.11)
RDW: 16.5 % — ABNORMAL HIGH (ref 11.5–15.5)
WBC: 10.7 10*3/uL — ABNORMAL HIGH (ref 4.0–10.5)
nRBC: 0 % (ref 0.0–0.2)

## 2020-01-02 LAB — SURGICAL PATHOLOGY

## 2020-01-02 MED ORDER — GABAPENTIN 100 MG PO CAPS: 200.0000 mg | ORAL_CAPSULE | Freq: Two times a day (BID) | ORAL | 0 refills | Status: DC

## 2020-01-02 MED ORDER — PANTOPRAZOLE SODIUM 40 MG PO TBEC: 40.0000 mg | DELAYED_RELEASE_TABLET | Freq: Every day | ORAL | 0 refills | Status: DC

## 2020-01-02 MED ORDER — ACETAMINOPHEN 500 MG PO TABS: 1000.0000 mg | ORAL_TABLET | Freq: Three times a day (TID) | ORAL | 0 refills | Status: AC

## 2020-01-02 MED ORDER — ONDANSETRON 4 MG PO TBDP: 4.0000 mg | ORAL_TABLET | Freq: Four times a day (QID) | ORAL | 0 refills | Status: DC | PRN

## 2020-01-02 MED ORDER — TRAMADOL HCL 50 MG PO TABS: 50.0000 mg | ORAL_TABLET | Freq: Four times a day (QID) | ORAL | 0 refills | Status: DC | PRN

## 2020-01-02 MED FILL — GABAPENTIN 100 MG CAPSULE: 100 | 5 days supply | Qty: 20 | Fill #0

## 2020-01-02 MED FILL — traMADol HCL 50 MG TABS: 50 | 2 days supply | Qty: 10 | Fill #0

## 2020-01-02 MED FILL — PANTOPRAZOLE SOD DR 40 MG T: 40 | 90 days supply | Qty: 90 | Fill #0

## 2020-01-02 MED FILL — ONDANSETRON ODT 4 MG TABLET: 4 | 5 days supply | Qty: 20 | Fill #0

## 2020-01-02 NOTE — Progress Notes (Signed)
Patient alert and oriented, Post op day 1.  Provided support and encouragement.  Encouraged pulmonary toilet, ambulation and small sips of liquids. Completed 12 ounces of bari clear fluids and started protein.  All questions answered.  Will continue to monitor.

## 2020-01-02 NOTE — Progress Notes (Signed)
Patient alert and oriented, pain is controlled. Patient is tolerating fluids, advanced to protein shake today, patient is tolerating well.  Reviewed Gastric sleeve discharge instructions with patient and patient is able to articulate understanding.  Provided information on BELT program, Support Group and [facility] outpatient pharmacy. All questions answered, will continue to monitor.  

## 2020-01-02 NOTE — Discharge Summary (Signed)
Physician Discharge Summary  [name] PYP:[id number] DOB: 08-04-1995 DOA: 01/01/2020  PCP: System, Provider Not In  Admit date: 01/01/2020 Discharge date: 01/02/2020  Recommendations for Outpatient Follow-up:    Follow-up Information    Carlena Hurl, PA-C. Go on 01/29/2020.   Specialty: General Surgery Why: at 4.  Please arrive 15 prior to appointment.  Thank you Contact information: [place] [address] [phone]        Greer Pickerel, MD. Schedule an appointment as soon as possible for a visit in 8 week(s).   Specialty: General Surgery Contact information: [place] [address] [phone]              Discharge Diagnoses:  Active Problems:   PCOS (polycystic ovarian syndrome)   Severe obesity (HCC)   Prediabetes   Mild obstructive sleep apnea   Surgical Procedure: Laparoscopic Sleeve Gastrectomy, upper endoscopy  Discharge Condition: Good Disposition: Home  Diet recommendation: Postoperative sleeve gastrectomy diet (liquids only)  Filed Weights   01/01/20 0533  Weight: 127.5 kg     Hospital Course:  The patient was admitted for a planned laparoscopic sleeve gastrectomy. Please see operative note. Preoperatively the patient was given 5000 units of subcutaneous heparin for DVT prophylaxis. Postoperative prophylactic Lovenox dosing was started on the evening of postoperative day 0. ERAS protocol was used. On the evening of postoperative day 0, the patient was started on water and ice chips. On postoperative day 1 the patient had no fever or tachycardia and was tolerating water in their diet was gradually advanced throughout the day. The patient was ambulating without difficulty. Their vital signs are stable without fever or tachycardia. Their hemoglobin had remained stable. The patient had received discharge instructions and counseling. They were deemed stable for discharge and had met discharge  criteria  BP (!) 141/86 (BP Location: Right Wrist)   Pulse 89   Temp 98.2 F (36.8 C) (Oral)   Resp 15   Ht _0  (1.575 m)   Wt 127.5 kg   LMP 12/02/2019 Comment: urine preg.pending 01/01/20  SpO2 99%   BMI 51.40 kg/m   Gen: alert, NAD, non-toxic appearing Pupils: equal, no scleral icterus Pulm: Lungs clear to auscultation, symmetric chest rise CV: regular rate and rhythm Abd: soft, mild approp tender, nondistended.  No cellulitis. No incisional hernia Ext: no edema, no calf tenderness Skin: no rash, no jaundice  Discharge Instructions  Discharge Instructions    Ambulate hourly while awake   Complete by: As directed    Call MD for:  difficulty breathing, headache or visual disturbances   Complete by: As directed    Call MD for:  persistant dizziness or light-headedness   Complete by: As directed    Call MD for:  persistant nausea and vomiting   Complete by: As directed    Call MD for:  redness, tenderness, or signs of infection (pain, swelling, redness, odor or green/yellow discharge around incision site)   Complete by: As directed    Call MD for:  severe uncontrolled pain   Complete by: As directed    Call MD for:  temperature >101 F   Complete by: As directed    Diet bariatric full liquid   Complete by: As directed    Discharge instructions   Complete by: As directed    See bariatric discharge instructions   Incentive spirometry   Complete by: As directed    Perform hourly while awake  Allergies as of 01/02/2020      Reactions   Contrast Media [iodinated Diagnostic Agents] Anaphylaxis   Isovue [iopamidol]    Patient began sneezing, then c/o her throat closing up, SOB, wheezing      Medication List    STOP taking these medications   Vienva 0.1-20 MG-MCG tablet Generic drug: levonorgestrel-ethinyl estradiol     TAKE these medications   acetaminophen 500 MG tablet Commonly known as: TYLENOL Take 2 tablets (1,000 mg total) by mouth every 8 (eight)  hours for 5 days.   gabapentin 100 MG capsule Commonly known as: NEURONTIN Take 2 capsules (200 mg total) by mouth every 12 (twelve) hours.   ondansetron 4 MG disintegrating tablet Commonly known as: ZOFRAN-ODT Take 1 tablet (4 mg total) by mouth every 6 (six) hours as needed for nausea or vomiting.   pantoprazole 40 MG tablet Commonly known as: PROTONIX Take 1 tablet (40 mg total) by mouth daily.   traMADol 50 MG tablet Commonly known as: ULTRAM Take 1 tablet (50 mg total) by mouth every 6 (six) hours as needed (pain).   Vitamin D (Ergocalciferol) 1.25 MG (50000 UNIT) Caps capsule Commonly known as: DRISDOL TAKE 1 CAPSULE (50,000 UNITS TOTAL) BY MOUTH EVERY 7 (SEVEN) DAYS.       Follow-up Information    Carlena Hurl, PA-C. Go on 01/29/2020.   Specialty: General Surgery Why: at 4.  Please arrive 15 prior to appointment.  Thank you Contact information: [place] [address] [phone]        Greer Pickerel, MD. Schedule an appointment as soon as possible for a visit in 8 week(s).   Specialty: General Surgery Contact information: [place] [address] [phone]                The results of significant diagnostics from this hospitalization (including imaging, microbiology, ancillary and laboratory) are listed below for reference.    Significant Diagnostic Studies: No results found.  Labs: Basic Metabolic Panel: Recent Labs  Lab 01/02/20 0504  NA 135  K 3.6  CL 104  CO2 24  GLUCOSE 116*  BUN <5*  CREATININE 0.57  CALCIUM 8.7*   Liver Function Tests: Recent Labs  Lab 01/02/20 0504  AST 19  ALT 23  ALKPHOS 67  BILITOT 0.6  PROT 7.2  ALBUMIN 3.7    CBC: Recent Labs  Lab 01/01/20 0916 01/02/20 0504  WBC  --  10.7*  NEUTROABS  --  7.1  HGB 12.3 11.7*  HCT 39.2 37.1  MCV  --  77.8*  PLT  --  345    CBG: No results for input(s): GLUCAP in the last 168 hours.  Active Problems:    PCOS (polycystic ovarian syndrome)   Severe obesity (HCC)   Prediabetes   Mild obstructive sleep apnea   Time coordinating discharge: 15 min  Signed:  Gayland Curry, MD [facility], [place] [phone] 01/02/2020, 8:02 AM

## 2020-01-02 NOTE — Plan of Care (Signed)
DC instructions were given to patient and family. All questions were answered and patient was taken to main entrance in a wheelchair.

## 2020-01-02 NOTE — Progress Notes (Signed)
Nutrition Brief Note  RD consulted for diet education for patient s/p bariatric surgery. Bariatric nurse coordinator providing education.   If nutrition issues arise, please consult RD.   Tawny Raspberry, MS, RD, LDN Inpatient Clinical Dietitian Contact information available via Amion  

## 2020-01-07 ENCOUNTER — Telehealth (HOSPITAL_COMMUNITY): Payer: Self-pay

## 2020-01-07 NOTE — Telephone Encounter (Signed)
Patient called to discuss post bariatric surgery follow up questions.  See below:   1.  Tell me about your pain and pain management?denies  2.  Let's talk about fluid intake.  How much total fluid are you taking in?60 ounces  3.  How much protein have you taken in the last 2 days?30 grams  4.  Have you had nausea?  Tell me about when have experienced nausea and what you did to help?denies  5.  Has the frequency or color changed with your urine?urine darker but lightens up  6.  Tell me what your incisions look like?no problem  7.  Have you been passing gas? BM? had BM yesterday  8.  If a problem or question were to arise who would you call?  Do you know contact numbers for [facility], [facility], and [facility]?aware of how to contact all services  9.  How has the walking going?walking around house  10.  How are your vitamins and calcium going?  How are you taking them?mvi and calcium no difficulty  Reminded of post op appointment with RD's

## 2020-01-15 ENCOUNTER — Other Ambulatory Visit: Payer: Self-pay

## 2020-01-15 ENCOUNTER — Encounter: Payer: BC Managed Care – PPO | Attending: General Surgery | Admitting: Skilled Nursing Facility1

## 2020-01-15 DIAGNOSIS — E669 Obesity, unspecified: Secondary | ICD-10-CM | POA: Diagnosis not present

## 2020-01-15 NOTE — Progress Notes (Signed)
2 Week Post-Operative Nutrition Class   Patient was seen on 01/15/2020 for Post-Operative Nutrition education at the [facility].    Surgery date: 01/01/2020 Surgery type: sleeve Start weight at [facility]: 303.8lbs Weight today: 267.1lbs   Body Composition Scale Date  Total Body Fat % 267.1  Visceral Fat 47.3  Fat-Free Mass % 13   Total Body Water % 52.6   Muscle-Mass lbs 40.8  Body Fat Displacement 31.3         Torso  lbs 78.4         Left Leg  lbs 15.6         Right Leg  lbs 15.6         Left Arm  lbs 7.8         Right Arm   lbs 7.8     The following the learning objectives were met by the patient during this course:  Identifies Phase 3 (Soft, High Proteins) Dietary Goals and will begin from 2 weeks post-operatively to 2 months post-operatively  Identifies appropriate sources of fluids and proteins   Identifies appropriate fat sources and healthy verses unhealthy fat types    States protein recommendations and appropriate sources post-operatively  Identifies the need for appropriate texture modifications, mastication, and bite sizes when consuming solids  Identifies appropriate multivitamin and calcium sources post-operatively  Describes the need for physical activity post-operatively and will follow MD recommendations  States when to call healthcare provider regarding medication questions or post-operative complications   Handouts given during class include:  Phase 3A: Soft, High Protein Diet Handout  Phase 3 High Protein Meals  Healthy Fats   Follow-Up Plan: Patient will follow-up at [facility] in 6 weeks for 2 month post-op nutrition visit for diet advancement per MD.

## 2020-01-22 ENCOUNTER — Telehealth: Payer: Self-pay | Admitting: Skilled Nursing Facility1

## 2020-01-22 NOTE — Telephone Encounter (Signed)
RD called pt to verify fluid intake once starting soft, solid proteins 2 week post-bariatric surgery.   Daily Fluid intake: Daily Protein intake:  Concerns/issues:   LVM 

## 2020-02-27 ENCOUNTER — Encounter: Payer: Self-pay | Attending: General Surgery | Admitting: Skilled Nursing Facility1

## 2020-02-27 ENCOUNTER — Other Ambulatory Visit: Payer: Self-pay

## 2020-02-27 NOTE — Progress Notes (Signed)
Bariatric Nutrition Follow-Up Visit Medical Nutrition Therapy    NUTRITION ASSESSMENT   Anthropometrics  Surgery date: 01/01/2020 Surgery type: sleeve Start weight at [facility]: 303.8lbs Weight today: 267.1lbs   Body Composition Scale 02/27/2020  Current Body Weight 261.6  Total Body Fat % 46.1  Visceral Fat 12  Fat-Free Mass % 53.8   Total Body Water % 41.4  Muscle-Mass lbs 32.1  BMI 47.7  Body Fat Displacement          Torso  lbs 74.9         Left Leg  lbs 14.9         Right Leg  lbs 14.9         Left Arm  lbs 7.4         Right Arm   lbs 7.4    Clinical  Medical hx: prediabetes, PCOS Medications: see list Labs:    Lifestyle & Dietary Hx  Pt states she noticed she cannot reheat meats in the Microwave without them making her sick. Pt states she is worried something is wrong with her because she has not been losing weight rapidly: Dietitian advised with her constipation and her exercise regimen she would not see rapid weight loss so she does not have anything to worry about at this point.   Dietitian advised pt if she does not have a bowel movement in the next couple days with increasing her fluid and non starchy vegetables she should call her surgeons for advice.   Estimated daily fluid intake: 40-50 oz Estimated daily protein intake: 60+ g Supplements: multi and calcium  Current average weekly physical activity: Youtube videos and gym: 5 days a week 60 minutes  24-Hr Dietary Recall First Meal: boiled egg or protein shake or yogurt Snack:  Second Meal: lunch meat + cheese Snack:  Third Meal: chicken or beef Snack: egg or yogurt Beverages: water, propel zero, flavored water  Post-Op Goals/ Signs/ Symptoms Using straws: no Drinking while eating: no Chewing/swallowing difficulties: no Changes in vision: no Changes to mood/headaches: no Hair loss/changes to skin/nails: no Difficulty focusing/concentrating: no Sweating: no Dizziness/lightheadedness:  no Palpitations: no  Carbonated/caffeinated beverages: no N/V/D/C/Gas: bowel movement every 5-6 days Abdominal pain: no Dumping syndrome: no  NUTRITION DIAGNOSIS  Overweight/obesity ([place]-3.3) related to past poor dietary habits and physical inactivity as evidenced by completed bariatric surgery and following dietary guidelines for continued weight loss and healthy nutrition status.     NUTRITION INTERVENTION Nutrition counseling (C-1) and education (E-2) to facilitate bariatric surgery goals, including: . Diet advancement to the next phase (phase 4) now including non starchy vegetables  . The importance of consuming adequate calories as well as certain nutrients daily due to the body's need for essential vitamins, minerals, and fats . The importance of daily physical activity and to reach a goal of at least 150 minutes of moderate to vigorous physical activity weekly (or as directed by their physician) due to benefits such as increased musculature and improved lab values . The importance of intuitive eating specifically learning hunger-satiety cues and understanding the importance of learning a new body: The importance of mindful eating to avoid grazing behaviors   Goals: -Continue to aim for a minimum of 64 fluid ounces 7 days a week with at least 30 ounces being plain water -Eat non-starchy vegetables 2 times a day 7 days a week -Start out with soft cooked vegetables today and tomorrow; if tolerated begin to eat raw vegetables or cooked including salads -Eat your 3  ounces of protein first then start in on your non-starchy vegetables; once you understand how much of your meal leads to satisfaction and not full while still eating 3 ounces of protein and non-starchy vegetables you can eat them in any order  -Continue to aim for 30 minutes of activity at least 5 times a week -Do NOT cook with/add to your food: alfredo sauce, cheese sauce, barbeque sauce, ketchup, fat back, butter, bacon grease,  grease, Crisco, OR SUGAR   Handouts Provided Include   Phase 4  Learning Style & Readiness for Change Teaching method utilized: Visual & Auditory  Demonstrated degree of understanding via: Teach Back  Readiness Level: Action Barriers to learning/adherence to lifestyle change: none identified   RD's Notes for Next Visit . Assess adherence to pt chosen goals   MONITORING & EVALUATION Dietary intake, weekly physical activity, body weight Next Steps Patient is to follow-up

## 2020-03-05 ENCOUNTER — Encounter (INDEPENDENT_AMBULATORY_CARE_PROVIDER_SITE_OTHER): Payer: Self-pay | Admitting: Family Medicine

## 2020-03-05 ENCOUNTER — Other Ambulatory Visit: Payer: Self-pay

## 2020-03-05 ENCOUNTER — Telehealth (INDEPENDENT_AMBULATORY_CARE_PROVIDER_SITE_OTHER): Payer: BC Managed Care – PPO | Admitting: Family Medicine

## 2020-03-05 DIAGNOSIS — Z9884 Bariatric surgery status: Secondary | ICD-10-CM | POA: Diagnosis not present

## 2020-03-05 DIAGNOSIS — E559 Vitamin D deficiency, unspecified: Secondary | ICD-10-CM | POA: Diagnosis not present

## 2020-03-05 DIAGNOSIS — Z6841 Body Mass Index (BMI) 40.0 and over, adult: Secondary | ICD-10-CM

## 2020-03-05 MED ORDER — VITAMIN D (ERGOCALCIFEROL) 1.25 MG (50000 UNIT) PO CAPS: 50000.0000 [IU] | ORAL_CAPSULE | ORAL | 0 refills | Status: DC

## 2020-03-06 NOTE — Progress Notes (Signed)
TeleHealth Visit:  Due to the COVID-19 pandemic, this visit was completed with telemedicine (audio/video) technology to reduce patient and provider exposure as well as to preserve personal protective equipment.   [name] has verbally consented to this TeleHealth visit. The patient is located at home, the provider is located at the [facility] office. The participants in this visit include the listed provider and patient. The visit was conducted today via MyChart video.   Chief Complaint: OBESITY [name] is here to discuss her progress with her obesity treatment plan along with follow-up of her obesity related diagnoses. [name] is on following a lower carbohydrate, vegetable and lean protein rich diet plan and states she is following her eating plan approximately 0% of the time. [name] states she is doing cardio and Youtube videos for 60 minutes 5 times per week.  Today's visit was #: 8 Starting weight: 323 lbs Starting date: 08/07/2019  Interim History: [name] is status post sleeve gastrectomy on 01/01/2020. Her weight at the time of surgery was 281 lbs. Last office visit with me was on 12/26/2019. She has been following up with her registered dietician and last visit was 02/27/2020 (weight of 261 lbs). She weighed 255 lbs yesterday at home. She takes 1 multivitamin daily, and she is eating only vegetables, meat, beans, eggs, and cheese. She has has gone from a 3X to a large in clothes and is quite happy with her progress since surgery.  Subjective:   1. Status post laparoscopic sleeve gastrectomy (01/01/20) [name]'s weight at the time of surgery was 281 lbs. She reports a weight of 255 lbs at home today (loss of 26 lbs).  2. Vitamin D deficiency [name]'s last Vit D level was very low at 7.1 on 08/07/2019. She is on weekly prescription Vit D.  Assessment/Plan:   1. Status post laparoscopic sleeve gastrectomy (01/01/20) Cardell Peach will continue to follow her meal plan provided  by her registered dietician.   2. Vitamin D deficiency Low Vitamin D level contributes to fatigue and are associated with obesity, breast, and colon cancer. We will refill prescription Vitamin D for 90 days with no refills. I advised [name] to continue vitamin D and follow up with PCP for continued management and recheck of levels.  - Vitamin D, Ergocalciferol, (DRISDOL) 1.25 MG (50000 UNIT) CAPS capsule; Take 1 capsule (50,000 Units total) by mouth every 7 (seven) days.  Dispense: 12 capsule; Refill: 0  3. Class 3 severe obesity with serious comorbidity and body mass index (BMI) of 45.0 to 49.9 in adult, unspecified obesity type (HCC) [name] is currently in the action stage of change. As such, her goal is to continue with weight loss efforts. She has agreed to follow the meal plan that was provided by her registered dietician (low carb).  Exercise goals: As is.  Behavioral modification strategies: increasing lean protein intake.  [name] has agreed to follow-up with our clinic as needed if she begins to gain weight or she stops losing weight prematurely.  Objective:   VITALS: Per patient if applicable, see vitals. GENERAL: Alert and in no acute distress. CARDIOPULMONARY: No increased WOB. Speaking in clear sentences.  PSYCH: Pleasant and cooperative. Speech normal rate and rhythm. Affect is appropriate. Insight and judgement are appropriate. Attention is focused, linear, and appropriate.  NEURO: Oriented as arrived to appointment on time with no prompting.   Lab Results  Component Value Date   CREATININE 0.57 01/02/2020   BUN <5 (L) 01/02/2020   NA 135 01/02/2020  K 3.6 01/02/2020   CL 104 01/02/2020   CO2 24 01/02/2020   Lab Results  Component Value Date   ALT 23 01/02/2020   AST 19 01/02/2020   ALKPHOS 67 01/02/2020   BILITOT 0.6 01/02/2020   Lab Results  Component Value Date   HGBA1C 5.8 (H) 12/21/2019   No results found for: INSULIN Lab Results  Component Value Date    TSH 1.910 08/07/2019   No results found for: CHOL, HDL, LDLCALC, LDLDIRECT, TRIG, CHOLHDL Lab Results  Component Value Date   WBC 10.7 (H) 01/02/2020   HGB 11.7 (L) 01/02/2020   HCT 37.1 01/02/2020   MCV 77.8 (L) 01/02/2020   PLT 345 01/02/2020   Lab Results  Component Value Date   IRON 48 08/07/2019   TIBC 359 08/07/2019   FERRITIN 29 08/07/2019    Attestation Statements:   Reviewed by clinician on day of visit: allergies, medications, problem list, medical history, surgical history, family history, social history, and previous encounter notes.   Trude Mcburney, am acting as Energy manager for [place], FNP-C.  I have reviewed the above documentation for accuracy and completeness, and I agree with the above. - Jesse Sans, FNP

## 2020-03-09 DIAGNOSIS — Z9884 Bariatric surgery status: Secondary | ICD-10-CM | POA: Insufficient documentation

## 2020-04-02 ENCOUNTER — Ambulatory Visit (INDEPENDENT_AMBULATORY_CARE_PROVIDER_SITE_OTHER): Payer: Self-pay | Admitting: Family Medicine

## 2020-05-28 ENCOUNTER — Encounter: Payer: BC Managed Care – PPO | Attending: General Surgery | Admitting: Skilled Nursing Facility1

## 2020-08-04 ENCOUNTER — Encounter (HOSPITAL_COMMUNITY): Payer: Self-pay | Admitting: Emergency Medicine

## 2020-08-04 ENCOUNTER — Emergency Department (HOSPITAL_COMMUNITY)
Admission: EM | Admit: 2020-08-04 | Discharge: 2020-08-05 | Disposition: A | Discharge status: Home / Self Care | Payer: BC Managed Care – PPO | Attending: Emergency Medicine | Admitting: Emergency Medicine

## 2020-08-04 ENCOUNTER — Other Ambulatory Visit: Payer: Self-pay

## 2020-08-04 DIAGNOSIS — Z5321 Procedure and treatment not carried out due to patient leaving prior to being seen by health care provider: Secondary | ICD-10-CM | POA: Insufficient documentation

## 2020-08-04 DIAGNOSIS — M25562 Pain in left knee: Secondary | ICD-10-CM | POA: Diagnosis present

## 2020-08-04 NOTE — ED Triage Notes (Signed)
Pt reports that she wants her L knee "checked." Denies injury or pain. States that it dislocates easily, but denies any recent dislocation or pain.

## 2020-08-21 ENCOUNTER — Other Ambulatory Visit: Payer: Self-pay

## 2020-08-21 ENCOUNTER — Encounter (HOSPITAL_COMMUNITY): Payer: Self-pay | Admitting: Emergency Medicine

## 2020-08-21 ENCOUNTER — Emergency Department (HOSPITAL_COMMUNITY)
Admission: EM | Admit: 2020-08-21 | Discharge: 2020-08-22 | Disposition: A | Discharge status: Home / Self Care | Payer: 59 | Attending: Emergency Medicine | Admitting: Emergency Medicine

## 2020-08-21 DIAGNOSIS — R42 Dizziness and giddiness: Secondary | ICD-10-CM | POA: Diagnosis not present

## 2020-08-21 DIAGNOSIS — Z5321 Procedure and treatment not carried out due to patient leaving prior to being seen by health care provider: Secondary | ICD-10-CM | POA: Insufficient documentation

## 2020-08-21 DIAGNOSIS — R109 Unspecified abdominal pain: Secondary | ICD-10-CM | POA: Insufficient documentation

## 2020-08-21 NOTE — ED Triage Notes (Signed)
Pt arrive POV for c/o abd pain with dizziness for the past 3 days, denies any n/v/d. Pt is AO x 4 during triage NAD noticed, no neuro deficit.

## 2020-08-22 LAB — COMPREHENSIVE METABOLIC PANEL
ALT: 12 U/L (ref 0–44)
AST: 15 U/L (ref 15–41)
Albumin: 3.5 g/dL (ref 3.5–5.0)
Alkaline Phosphatase: 73 U/L (ref 38–126)
Anion gap: 9 (ref 5–15)
BUN: 11 mg/dL (ref 6–20)
CO2: 23 mmol/L (ref 22–32)
Calcium: 8.6 mg/dL — ABNORMAL LOW (ref 8.9–10.3)
Chloride: 104 mmol/L (ref 98–111)
Creatinine, Ser: 0.7 mg/dL (ref 0.44–1.00)
GFR, Estimated: >60 mL/min (ref >60)
Glucose, Bld: 77 mg/dL (ref 70–99)
Potassium: 3.3 mmol/L — ABNORMAL LOW (ref 3.5–5.1)
Sodium: 136 mmol/L (ref 135–145)
Total Bilirubin: 0.8 mg/dL (ref 0.3–1.2)
Total Protein: 6.7 g/dL (ref 6.5–8.1)

## 2020-08-22 LAB — CBC
HCT: 34.7 % — ABNORMAL LOW (ref 36.0–46.0)
Hemoglobin: 11 g/dL — ABNORMAL LOW (ref 12.0–15.0)
MCH: 25.1 pg — ABNORMAL LOW (ref 26.0–34.0)
MCHC: 31.7 g/dL (ref 30.0–36.0)
MCV: 79.2 fL — ABNORMAL LOW (ref 80.0–100.0)
Platelets: 348 10*3/uL (ref 150–400)
RBC: 4.38 MIL/uL (ref 3.87–5.11)
RDW: 15.2 % (ref 11.5–15.5)
WBC: 5.1 10*3/uL (ref 4.0–10.5)
nRBC: 0 % (ref 0.0–0.2)

## 2020-08-22 LAB — URINALYSIS, ROUTINE W REFLEX MICROSCOPIC
Bilirubin Urine: NEGATIVE
Glucose, UA: NEGATIVE mg/dL
Hgb urine dipstick: NEGATIVE
Ketones, ur: 5 mg/dL — AB
Leukocytes,Ua: NEGATIVE
Nitrite: NEGATIVE
Protein, ur: NEGATIVE mg/dL
Specific Gravity, Urine: 1.035 — ABNORMAL HIGH (ref 1.005–1.030)
pH: 5 (ref 5.0–8.0)

## 2020-08-22 LAB — I-STAT BETA HCG BLOOD, ED (MC, WL, AP ONLY): I-stat hCG, quantitative: <5 m[IU]/mL (ref <5)

## 2020-08-22 LAB — LIPASE, BLOOD: Lipase: 20 U/L (ref 11–51)

## 2020-08-22 NOTE — ED Notes (Signed)
Pt did not respond when called for vitals check, not seen inside or outside lobby

## 2020-10-16 ENCOUNTER — Other Ambulatory Visit: Payer: Self-pay

## 2020-10-16 ENCOUNTER — Encounter: Payer: Self-pay | Admitting: Emergency Medicine

## 2020-10-16 ENCOUNTER — Ambulatory Visit
Admission: EM | Admit: 2020-10-16 | Discharge: 2020-10-16 | Disposition: A | Discharge status: Home / Self Care | Payer: Self-pay | Attending: Internal Medicine | Admitting: Internal Medicine

## 2020-10-16 DIAGNOSIS — Z113 Encounter for screening for infections with a predominantly sexual mode of transmission: Secondary | ICD-10-CM

## 2020-10-16 DIAGNOSIS — Z3202 Encounter for pregnancy test, result negative: Secondary | ICD-10-CM

## 2020-10-16 DIAGNOSIS — N898 Other specified noninflammatory disorders of vagina: Secondary | ICD-10-CM

## 2020-10-16 LAB — POCT URINE PREGNANCY: Preg Test, Ur: NEGATIVE

## 2020-10-16 NOTE — Discharge Instructions (Signed)
Your vaginal swab is pending.  We will call if there are any positives and treat as appropriate.  Please refrain from any sexual activity until test results are complete.

## 2020-10-16 NOTE — ED Provider Notes (Addendum)
[name]    CSN: [id number] Arrival date & time: 10/16/20  1600      History   Chief Complaint Chief Complaint  Patient presents with   Vaginal Itching    HPI [name] is a 25 y.o. female.   Patient presents with white vaginal discharge and itching that started yesterday.  Patient used a new soap and is not sure if symptoms are related to that.  Denies any irregular vaginal bleeding, urinary burning, urinary frequency, hematuria, abdominal pain, fever, back pain.  Denies any known exposure to STD but has had unprotected sexual intercourse recently.  Last menstrual cycle was in the month of August.   Vaginal Itching   Past Medical History:  Diagnosis Date   Anemia    iron   Asthma    spring   Back pain    Joint pain    Lower extremity edema    PCOS (polycystic ovarian syndrome)    Pre-diabetes    Prediabetes    Seasonal allergies    SOB (shortness of breath)     Patient Active Problem List   Diagnosis Date Noted   Status post laparoscopic sleeve gastrectomy (01/01/20) 03/09/2020   Mild obstructive sleep apnea 01/01/2020   Vitamin D deficiency 09/04/2019   PCOS (polycystic ovarian syndrome) 09/04/2019   Depression 09/04/2019   Class 3 severe obesity with serious comorbidity and body mass index (BMI) of 45.0 to 49.9 in adult [facility]) 09/04/2019   Prediabetes 09/04/2019   Absolute anemia 09/04/2019    Past Surgical History:  Procedure Laterality Date   FEMUR FRACTURE SURGERY Right 2010   LAPAROSCOPIC GASTRIC SLEEVE RESECTION N/A 01/01/2020   Procedure: LAPAROSCOPIC GASTRIC SLEEVE RESECTION;  Surgeon: Gaynelle Adu, MD;  Location: [facility];  Service: General;  Laterality: N/A;   UPPER GI ENDOSCOPY N/A 01/01/2020   Procedure: UPPER GI ENDOSCOPY;  Surgeon: Gaynelle Adu, MD;  Location: [facility];  Service: General;  Laterality: N/A;    OB History     Gravida  0   Para  0   Term  0   Preterm  0   AB  0   Living  0      SAB  0   IAB   0   Ectopic  0   Multiple  0   Live Births               Home Medications    Prior to Admission medications   Medication Sig Start Date End Date Taking? Authorizing Provider  gabapentin (NEURONTIN) 100 MG capsule TAKE 2 CAPSULES BY MOUTH EVERY 12 HOURS. 01/02/20 01/01/21  Gaynelle Adu, MD  ondansetron (ZOFRAN-ODT) 4 MG disintegrating tablet DISSOLVE 1 TABLET BY MOUTH EVERY 6 HOURS AS NEEDED FOR NAUSEA OR VOMITING. 01/02/20 01/01/21  Gaynelle Adu, MD  pantoprazole (PROTONIX) 40 MG tablet TAKE 1 TABLET BY MOUTH DAILY. 01/02/20 01/01/21  Gaynelle Adu, MD  Vitamin D, Ergocalciferol, (DRISDOL) 1.25 MG (50000 UNIT) CAPS capsule Take 1 capsule (50,000 Units total) by mouth every 7 (seven) days. 03/05/20   [place], Thermon Leyland, FNP    Family History Family History  Problem Relation Age of Onset   Diabetes Mother    Diabetes Father    Hypertension Father    Sleep apnea Father    Obesity Father    Cancer Other     Social History Social History   Tobacco Use   Smoking status: Never   Smokeless tobacco: Never  Vaping Use  Vaping Use: Never used  Substance Use Topics   Alcohol use: Yes    Comment: occ   Drug use: No     Allergies   Contrast media [iodinated diagnostic agents] and Isovue [iopamidol]   Review of Systems Review of Systems Per HPI  Physical Exam Triage Vital Signs ED Triage Vitals  Enc Vitals Group     BP 10/16/20 1636 121/85     Pulse Rate 10/16/20 1636 78     Resp 10/16/20 1636 18     Temp 10/16/20 1636 98.4 F (36.9 C)     Temp Source 10/16/20 1636 Oral     SpO2 10/16/20 1636 99 %     Weight 10/16/20 1637 235 lb (106.6 kg)     Height 10/16/20 1637 5\' 2"  (1.575 m)     Head Circumference --      Peak Flow --      Pain Score 10/16/20 1637 7     Pain Loc --      Pain Edu? --      Excl. in GC? --    No data found.  Updated Vital Signs BP 121/85 (BP Location: Left Arm)   Pulse 78   Temp 98.4 F (36.9 C) (Oral)   Resp 18   Ht 5\' 2"  (1.575  m)   Wt 235 lb (106.6 kg)   LMP 09/24/2020   SpO2 99%   BMI 42.98 kg/m   Visual Acuity Right Eye Distance:   Left Eye Distance:   Bilateral Distance:    Right Eye Near:   Left Eye Near:    Bilateral Near:     Physical Exam Constitutional:      Appearance: Normal appearance.  HENT:     Head: Normocephalic and atraumatic.  Eyes:     Extraocular Movements: Extraocular movements intact.     Conjunctiva/sclera: Conjunctivae normal.  Pulmonary:     Effort: Pulmonary effort is normal.  Genitourinary:    Comments: Deferred with shared decision making.  Self swab performed. Neurological:     General: No focal deficit present.     Mental Status: She is alert and oriented to person, place, and time. Mental status is at baseline.  Psychiatric:        Mood and Affect: Mood normal.        Behavior: Behavior normal.        Thought Content: Thought content normal.        Judgment: Judgment normal.     UC Treatments / Results  Labs (all labs ordered are listed, but only abnormal results are displayed) Labs Reviewed  POCT URINE PREGNANCY  CERVICOVAGINAL ANCILLARY ONLY    EKG   Radiology No results found.  Procedures Procedures (including critical care time)  Medications Ordered in UC Medications - No data to display  Initial Impression / Assessment and Plan / UC Course  I have reviewed the triage vital signs and the nursing notes.  Pertinent labs & imaging results that were available during my care of the patient were reviewed by me and considered in my medical decision making (see chart for details).     Cervicovaginal swab pending.  Will await results for treatment.  Differential diagnoses include BV, STD, candidiasis of vagina. patient was agreeable with plan.  Do not think urinalysis is necessary at this time due to patient's current clinical symptoms.  Urine pregnancy was negative.  Advised patient to refrain from sexual activity until test results and treatment  are complete.Discussed strict  return precautions. Patient verbalized understanding and is agreeable with plan.  Final Clinical Impressions(s) / UC Diagnoses   Final diagnoses:  Screening examination for venereal disease  Vaginal discharge     Discharge Instructions      Your vaginal swab is pending.  We will call if there are any positives and treat as appropriate.  Please refrain from any sexual activity until test results are complete.   ED Prescriptions   None    PDMP not reviewed this encounter.   Lance Muss, FNP 10/16/20 1713    Lance Muss, FNP 10/16/20 [phone]

## 2020-10-16 NOTE — ED Triage Notes (Signed)
Patient c/o vaginal itching, cloudy discharge, used a new soap and unsure if she is having a reaction to that.  Patient requesting STI testing.

## 2020-10-17 ENCOUNTER — Telehealth (HOSPITAL_COMMUNITY): Payer: Self-pay | Admitting: Emergency Medicine

## 2020-10-17 LAB — CERVICOVAGINAL ANCILLARY ONLY
Bacterial Vaginitis (gardnerella): POSITIVE — AB
Candida Glabrata: NEGATIVE
Candida Vaginitis: POSITIVE — AB
Chlamydia: NEGATIVE
Comment: NEGATIVE
Comment: NEGATIVE
Comment: NEGATIVE
Comment: NEGATIVE
Comment: NEGATIVE
Comment: NORMAL
Neisseria Gonorrhea: NEGATIVE
Trichomonas: NEGATIVE

## 2020-10-17 MED ORDER — METRONIDAZOLE 500 MG PO TABS: 500.0000 mg | ORAL_TABLET | Freq: Two times a day (BID) | ORAL | 0 refills | Status: DC

## 2020-10-17 MED ORDER — FLUCONAZOLE 150 MG PO TABS: 150.0000 mg | ORAL_TABLET | Freq: Once | ORAL | 0 refills | Status: AC

## 2021-03-15 ENCOUNTER — Emergency Department (HOSPITAL_COMMUNITY)
Admission: EM | Admit: 2021-03-15 | Discharge: 2021-03-15 | Disposition: A | Discharge status: Home / Self Care | Payer: Self-pay | Attending: Emergency Medicine | Admitting: Emergency Medicine

## 2021-03-15 ENCOUNTER — Ambulatory Visit
Admission: EM | Admit: 2021-03-15 | Discharge: 2021-03-15 | Disposition: A | Discharge status: Home / Self Care | Payer: Self-pay | Attending: Internal Medicine | Admitting: Internal Medicine

## 2021-03-15 ENCOUNTER — Encounter (HOSPITAL_COMMUNITY): Payer: Self-pay | Admitting: *Deleted

## 2021-03-15 ENCOUNTER — Other Ambulatory Visit: Payer: Self-pay

## 2021-03-15 ENCOUNTER — Emergency Department (HOSPITAL_COMMUNITY): Payer: Self-pay

## 2021-03-15 ENCOUNTER — Encounter: Payer: Self-pay | Admitting: Emergency Medicine

## 2021-03-15 DIAGNOSIS — N939 Abnormal uterine and vaginal bleeding, unspecified: Secondary | ICD-10-CM | POA: Insufficient documentation

## 2021-03-15 DIAGNOSIS — Z113 Encounter for screening for infections with a predominantly sexual mode of transmission: Secondary | ICD-10-CM | POA: Insufficient documentation

## 2021-03-15 DIAGNOSIS — N898 Other specified noninflammatory disorders of vagina: Secondary | ICD-10-CM | POA: Insufficient documentation

## 2021-03-15 DIAGNOSIS — R1030 Lower abdominal pain, unspecified: Secondary | ICD-10-CM | POA: Insufficient documentation

## 2021-03-15 DIAGNOSIS — R102 Pelvic and perineal pain: Secondary | ICD-10-CM

## 2021-03-15 LAB — COMPREHENSIVE METABOLIC PANEL
ALT: 9 U/L (ref 0–44)
AST: 17 U/L (ref 15–41)
Albumin: 3.3 g/dL — ABNORMAL LOW (ref 3.5–5.0)
Alkaline Phosphatase: 60 U/L (ref 38–126)
Anion gap: 7 (ref 5–15)
BUN: 12 mg/dL (ref 6–20)
CO2: 24 mmol/L (ref 22–32)
Calcium: 8.6 mg/dL — ABNORMAL LOW (ref 8.9–10.3)
Chloride: 109 mmol/L (ref 98–111)
Creatinine, Ser: 0.79 mg/dL (ref 0.44–1.00)
GFR, Estimated: >60 mL/min (ref >60)
Glucose, Bld: 89 mg/dL (ref 70–99)
Potassium: 4 mmol/L (ref 3.5–5.1)
Sodium: 140 mmol/L (ref 135–145)
Total Bilirubin: 0.4 mg/dL (ref 0.3–1.2)
Total Protein: 6.4 g/dL — ABNORMAL LOW (ref 6.5–8.1)

## 2021-03-15 LAB — CBC WITH DIFFERENTIAL/PLATELET
Abs Immature Granulocytes: 0.02 10*3/uL (ref 0.00–0.07)
Basophils Absolute: 0.1 10*3/uL (ref 0.0–0.1)
Basophils Relative: 1 %
Eosinophils Absolute: 0.3 10*3/uL (ref 0.0–0.5)
Eosinophils Relative: 4 %
HCT: 33.7 % — ABNORMAL LOW (ref 36.0–46.0)
Hemoglobin: 10.6 g/dL — ABNORMAL LOW (ref 12.0–15.0)
Immature Granulocytes: 0 %
Lymphocytes Relative: 42 %
Lymphs Abs: 2.5 10*3/uL (ref 0.7–4.0)
MCH: 24.6 pg — ABNORMAL LOW (ref 26.0–34.0)
MCHC: 31.5 g/dL (ref 30.0–36.0)
MCV: 78.2 fL — ABNORMAL LOW (ref 80.0–100.0)
Monocytes Absolute: 0.7 10*3/uL (ref 0.1–1.0)
Monocytes Relative: 12 %
Neutro Abs: 2.4 10*3/uL (ref 1.7–7.7)
Neutrophils Relative %: 41 %
Platelets: 349 10*3/uL (ref 150–400)
RBC: 4.31 MIL/uL (ref 3.87–5.11)
RDW: 16.6 % — ABNORMAL HIGH (ref 11.5–15.5)
WBC: 6 10*3/uL (ref 4.0–10.5)
nRBC: 0 % (ref 0.0–0.2)

## 2021-03-15 LAB — WET PREP, GENITAL
Clue Cells Wet Prep HPF POC: NONE SEEN
Sperm: NONE SEEN
Trich, Wet Prep: NONE SEEN
WBC, Wet Prep HPF POC: NONE SEEN — AB (ref <10)
Yeast Wet Prep HPF POC: NONE SEEN

## 2021-03-15 LAB — POCT URINE PREGNANCY: Preg Test, Ur: NEGATIVE

## 2021-03-15 LAB — HIV ANTIBODY (ROUTINE TESTING W REFLEX): HIV Screen 4th Generation wRfx: NONREACTIVE

## 2021-03-15 NOTE — ED Triage Notes (Signed)
Pt sent from UC for r/o STI d/t irregular vaginal bleeding.

## 2021-03-15 NOTE — Discharge Instructions (Signed)
Please go to the emergency department as soon as you leave urgent care for further evaluation and management. ?

## 2021-03-15 NOTE — Discharge Instructions (Addendum)
Your test for gonorrhea and chlamydia are pending.  If positive, you should receive a phone call and instructions about treatment.  If negative, you will not receive a phone call.  Either way, you may check online on MyChart. Your blood counts were not significantly different than what they have been before.  However if you continue to have vaginal bleeding, this will need to be monitored. Call the OB/GYN listed below to set up a follow-up appointment for further evaluation and management of your vaginal bleeding. Return to the emergency room if you develop severe worsening pain, high fevers, if you pass out or become dizzy and lightheaded, or with any new, worsening, or concerning symptoms  Please take Ibuprofen (Advil, motrin) and Tylenol (acetaminophen) to relieve your pain.    You may take up to 600 MG (3 pills) of normal strength ibuprofen every 8 hours as needed.   You make take tylenol, up to 1,000 mg (two extra strength pills) every 8 hours as needed.   It is safe to take ibuprofen and tylenol at the same time as they work differently.   Do not take more than 3,000 mg tylenol in a 24 hour period (not more than one dose every 8 hours.  Please check all medication labels as many medications such as pain and cold medications may contain tylenol.  Do not drink alcohol while taking these medications.  Do not take other NSAID'S while taking ibuprofen (such as aleve or naproxen).  Please take ibuprofen with food to decrease stomach upset.

## 2021-03-15 NOTE — ED Provider Notes (Signed)
I assumed care of patient at shift change from previous team, please see their note for full H&P. Briefly patient is here for evaluation of vaginal bleeding that started this morning.  Her labs so far have been reassuring without evidence of significant anemia, she had bleeding from the cervix on pelvic exam per prior provider but was able to be cleared with 2 fox swabs.  Plan is to follow-up on ultrasound.  Ultrasound shows a possible left-sided cyst rupture. Patient states that she has had similar.  She had declined pain medication while in the emergency room.  She is given follow-up with OB/GYN.  OTC meds as needed for pain.  I discussed her results with her. She is generally well appearing in no distress, is awake and alert interacts and answers questions appropriately.  Return precautions were discussed with patient who states their understanding.  At the time of discharge patient denied any unaddressed complaints or concerns.  Patient is agreeable for discharge home.  Note: Portions of this report may have been transcribed using voice recognition software. Every effort was made to ensure accuracy; however, inadvertent computerized transcription errors may be present      Ollen Gross 03/15/21 1720    Hayden Rasmussen, MD 03/16/21 [phone]

## 2021-03-15 NOTE — ED Provider Notes (Signed)
[name]    CSN: [id number] Arrival date & time: 03/15/21  1150      History   Chief Complaint Chief Complaint  Patient presents with   Vaginal Bleeding    HPI [name] is a 26 y.o. female.   Patient presents with abnormal vaginal bleeding that started this morning upon awakening.  She also reports that she had a white "pasty" vaginal discharge that was present 1 week prior.  She is concerned for STD so she would like to have STD testing.  Denies any known exposure to STD but has had unprotected sexual intercourse prior to symptoms starting.  Patient reports that she has bled through 3 different pairs of pants this morning and has been having to change her menstrual pad quite often since awakening.  Denies urinary burning, urinary frequency, abdominal pain, pelvic pain, fever, back pain, hematuria.  Last menstrual cycle was approximately 3 weeks ago.  Patient is not on birth control.   Vaginal Bleeding  Past Medical History:  Diagnosis Date   Anemia    iron   Asthma    spring   Back pain    Joint pain    Lower extremity edema    PCOS (polycystic ovarian syndrome)    Pre-diabetes    Prediabetes    Seasonal allergies    SOB (shortness of breath)     Patient Active Problem List   Diagnosis Date Noted   Status post laparoscopic sleeve gastrectomy (01/01/20) 03/09/2020   Mild obstructive sleep apnea 01/01/2020   Vitamin D deficiency 09/04/2019   PCOS (polycystic ovarian syndrome) 09/04/2019   Depression 09/04/2019   Class 3 severe obesity with serious comorbidity and body mass index (BMI) of 45.0 to 49.9 in adult [facility]) 09/04/2019   Prediabetes 09/04/2019   Absolute anemia 09/04/2019    Past Surgical History:  Procedure Laterality Date   FEMUR FRACTURE SURGERY Right 2010   LAPAROSCOPIC GASTRIC SLEEVE RESECTION N/A 01/01/2020   Procedure: LAPAROSCOPIC GASTRIC SLEEVE RESECTION;  Surgeon: Greer Pickerel, MD;  Location: [facility];  Service: General;   Laterality: N/A;   UPPER GI ENDOSCOPY N/A 01/01/2020   Procedure: UPPER GI ENDOSCOPY;  Surgeon: Greer Pickerel, MD;  Location: [facility];  Service: General;  Laterality: N/A;    OB History     Gravida  0   Para  0   Term  0   Preterm  0   AB  0   Living  0      SAB  0   IAB  0   Ectopic  0   Multiple  0   Live Births               Home Medications    Prior to Admission medications   Medication Sig Start Date End Date Taking? Authorizing Provider  gabapentin (NEURONTIN) 100 MG capsule TAKE 2 CAPSULES BY MOUTH EVERY 12 HOURS. 01/02/20 01/01/21  Greer Pickerel, MD  metroNIDAZOLE (FLAGYL) 500 MG tablet Take 1 tablet (500 mg total) by mouth 2 (two) times daily. 10/17/20   Chase Picket, MD  pantoprazole (PROTONIX) 40 MG tablet TAKE 1 TABLET BY MOUTH DAILY. 01/02/20 01/01/21  Greer Pickerel, MD  Vitamin D, Ergocalciferol, (DRISDOL) 1.25 MG (50000 UNIT) CAPS capsule Take 1 capsule (50,000 Units total) by mouth every 7 (seven) days. 03/05/20   Whitmire, Joneen Boers, FNP    Family History Family History  Problem Relation Age of Onset   Diabetes Mother    Diabetes  Father    Hypertension Father    Sleep apnea Father    Obesity Father    Cancer Other     Social History Social History   Tobacco Use   Smoking status: Never   Smokeless tobacco: Never  Vaping Use   Vaping Use: Never used  Substance Use Topics   Alcohol use: Yes    Comment: occ   Drug use: No     Allergies   Contrast media [iodinated contrast media] and Isovue [iopamidol]   Review of Systems Review of Systems Per HPI  Physical Exam Triage Vital Signs ED Triage Vitals  Enc Vitals Group     BP 03/15/21 1158 116/82     Pulse Rate 03/15/21 1158 82     Resp 03/15/21 1158 20     Temp 03/15/21 1158 98.1 F (36.7 C)     Temp Source 03/15/21 1158 Oral     SpO2 03/15/21 1158 99 %     Weight 03/15/21 1200 235 lb (106.6 kg)     Height 03/15/21 1200 5\' 2"  (1.575 m)     Head Circumference --       Peak Flow --      Pain Score 03/15/21 1200 5     Pain Loc --      Pain Edu? --      Excl. in [place]? --    No data found.  Updated Vital Signs BP 116/82 (BP Location: Left Arm)    Pulse 82    Temp 98.1 F (36.7 C) (Oral)    Resp 20    Ht 5\' 2"  (1.575 m)    Wt 235 lb (106.6 kg)    LMP 03/01/2021    SpO2 99%    BMI 42.98 kg/m   Visual Acuity Right Eye Distance:   Left Eye Distance:   Bilateral Distance:    Right Eye Near:   Left Eye Near:    Bilateral Near:     Physical Exam Constitutional:      General: She is not in acute distress.    Appearance: Normal appearance. She is not toxic-appearing or diaphoretic.  HENT:     Head: Normocephalic and atraumatic.  Eyes:     Extraocular Movements: Extraocular movements intact.     Conjunctiva/sclera: Conjunctivae normal.  Cardiovascular:     Rate and Rhythm: Normal rate and regular rhythm.     Pulses: Normal pulses.     Heart sounds: Normal heart sounds.  Pulmonary:     Effort: Pulmonary effort is normal. No respiratory distress.     Breath sounds: Normal breath sounds.  Abdominal:     General: Bowel sounds are normal. There is no distension.     Palpations: Abdomen is soft.     Tenderness: There is no abdominal tenderness.  Genitourinary:    Comments: Deferred with shared decision making.  Self swab performed. Neurological:     General: No focal deficit present.     Mental Status: She is alert and oriented to person, place, and time. Mental status is at baseline.  Psychiatric:        Mood and Affect: Mood normal.        Behavior: Behavior normal.        Thought Content: Thought content normal.        Judgment: Judgment normal.     UC Treatments / Results  Labs (all labs ordered are listed, but only abnormal results are displayed) Labs Reviewed  POCT URINE PREGNANCY  CERVICOVAGINAL ANCILLARY ONLY    EKG   Radiology No results found.  Procedures Procedures (including critical care time)  Medications Ordered in  UC Medications - No data to display  Initial Impression / Assessment and Plan / UC Course  I have reviewed the triage vital signs and the nursing notes.  Pertinent labs & imaging results that were available during my care of the patient were reviewed by me and considered in my medical decision making (see chart for details).     Due to the amount of vaginal bleeding that patient is experiencing, patient was advised to go to the hospital for more extensive evaluation and management.  Patient was agreeable with plan.  Differential diagnoses include STDs but the amount of bleeding is concerning.  Vital signs stable at discharge.  Agree with patient self transport to the hospital.  Urine pregnancy was negative. STD testing completed prior to provider exam due to patient request. Final Clinical Impressions(s) / UC Diagnoses   Final diagnoses:  Abnormal vaginal bleeding  Screening examination for venereal disease     Discharge Instructions      Please go to the emergency department as soon as you leave urgent care for further evaluation and management.    ED Prescriptions   None    PDMP not reviewed this encounter.   Teodora Medici, [place] 03/15/21 1258

## 2021-03-15 NOTE — ED Triage Notes (Signed)
Patient states she had a menstrual cycle x 2 weeks ago, last week she had a white pasty vaginal discharge, this morning she awoke with vaginal bleeding which is abnormal for her.  Concern for STI.  No urinary sx's.

## 2021-03-15 NOTE — ED Provider Notes (Signed)
[facility] EMERGENCY DEPARTMENT Provider Note   CSN: [id number] Arrival date & time: 03/15/21  1252     History  Chief Complaint  Patient presents with   Vaginal Bleeding    [name] is a 26 y.o. female presenting for evaluation of vaginal bleeding, lower abdominal pain, vaginal discharge.  Patient states she finished her period about 2 weeks ago.  The past of the days, she has noted thick white vaginal discharge.  Today she woke up in "a puddle of blood."  She states bleeding is both bright red and dark black, and does have clots.  She is continued to bleed heavily today.  She is at urgent care, but is recommend she come to the ER for further evaluation.  She reports feeling generally weak today, no lightheadedness or dizziness.  No fevers or chills.  No chest pain, shortness of breath, cough, nausea, vomiting.  He has had several hours of lower abdominal pain.  No abnormal bowel movements.  She is not on a blood thinners.  She does have a history of PCOS, however since weight loss surgery about a year and a half ago she has had more regular periods, occurring about once a month and usually not very heavy.  She does not currently have an OB/GYN.  She is not on any hormones.  She is sexually active.  HPI     Home Medications Prior to Admission medications   Medication Sig Start Date End Date Taking? Authorizing Provider  gabapentin (NEURONTIN) 100 MG capsule TAKE 2 CAPSULES BY MOUTH EVERY 12 HOURS. 01/02/20 01/01/21  Gaynelle Adu, MD  metroNIDAZOLE (FLAGYL) 500 MG tablet Take 1 tablet (500 mg total) by mouth 2 (two) times daily. 10/17/20   Merrilee Jansky, MD  pantoprazole (PROTONIX) 40 MG tablet TAKE 1 TABLET BY MOUTH DAILY. 01/02/20 01/01/21  Gaynelle Adu, MD  Vitamin D, Ergocalciferol, (DRISDOL) 1.25 MG (50000 UNIT) CAPS capsule Take 1 capsule (50,000 Units total) by mouth every 7 (seven) days. 03/05/20   Whitmire, Thermon Leyland, FNP      Allergies    Contrast  media [iodinated contrast media] and Isovue [iopamidol]    Review of Systems   Review of Systems  Gastrointestinal:  Positive for abdominal pain.  Genitourinary:  Positive for vaginal bleeding and vaginal discharge.  Neurological:  Positive for weakness.  All other systems reviewed and are negative.  Physical Exam Updated Vital Signs BP 122/82 (BP Location: Right Arm)    Pulse 77    Temp 98.6 F (37 C) (Oral)    Resp 16    LMP 03/01/2021    SpO2 100%  Physical Exam Vitals and nursing note reviewed. Exam conducted with a chaperone present.  Constitutional:      General: She is not in acute distress.    Appearance: Normal appearance. She is obese.     Comments: Resting in the bed in no acute distress  HENT:     Head: Normocephalic and atraumatic.  Eyes:     Extraocular Movements: Extraocular movements intact.     Conjunctiva/sclera: Conjunctivae normal.     Pupils: Pupils are equal, round, and reactive to light.  Cardiovascular:     Rate and Rhythm: Normal rate and regular rhythm.     Pulses: Normal pulses.  Pulmonary:     Effort: Pulmonary effort is normal. No respiratory distress.     Breath sounds: Normal breath sounds. No wheezing.     Comments: Speaking in full sentences.  Clear lung sounds in all fields. Abdominal:     General: There is no distension.     Palpations: Abdomen is soft. There is no mass.     Tenderness: There is abdominal tenderness. There is no guarding or rebound.     Comments: Mild tenderness palpation of suprapubic and left lower quadrant abdomen.  No rigidity, guarding, distention.  Negative rebound.  No peritonitis.  Genitourinary:    Cervix: Cervical bleeding present.     Uterus: Tender.      Comments: Blood noted on thighs and outer labia.  No source of bleeding noted externally. Minimal blood noted in the vaginal canal, and very minimal blood noted coming from the cervical os.  Cervix is not friable.  Discomfort with bimanual exam, however not more  on one side than the other. No discharge noted Musculoskeletal:        General: Normal range of motion.     Cervical back: Normal range of motion and neck supple.  Skin:    General: Skin is warm and dry.     Capillary Refill: Capillary refill takes less than 2 seconds.  Neurological:     Mental Status: She is alert and oriented to person, place, and time.  Psychiatric:        Mood and Affect: Mood and affect normal.        Speech: Speech normal.        Behavior: Behavior normal.    ED Results / Procedures / Treatments   Labs (all labs ordered are listed, but only abnormal results are displayed) Labs Reviewed  CBC WITH DIFFERENTIAL/PLATELET - Abnormal; Notable for the following components:      Result Value   Hemoglobin 10.6 (*)    HCT 33.7 (*)    MCV 78.2 (*)    MCH 24.6 (*)    RDW 16.6 (*)    All other components within normal limits  WET PREP, GENITAL  COMPREHENSIVE METABOLIC PANEL  RPR  HIV ANTIBODY (ROUTINE TESTING W REFLEX)  GC/CHLAMYDIA PROBE AMP ([place]) NOT AT [facility]    EKG None  Radiology No results found.  Procedures Procedures    Medications Ordered in ED Medications - No data to display  ED Course/ Medical Decision Making/ A&P                           Medical Decision Making Amount and/or Complexity of Data Reviewed Labs: ordered. Radiology: ordered.    This patient presents to the ED for concern of vaginal bleeding, lower abdominal pain, vaginal discharge.  This involves a number of treatment options, and is a complaint that carries with it a moderate risk of complications and morbidity.  The differential diagnosis includes DUB, fibroids, infection, PCOS, endometriosis, spontaneous miscarriage.   Co morbidities:  PCOS   Additional history: Reviewed urgent care note.  Patient had negative pregnancy test in the urgent care.  She did self swab for STDs, however patient would like repeat testing as she does not feel she got a good  sample   Lab Tests:  I ordered, and personally interpreted labs.  The pertinent results include: Hemoglobin slightly low at 10.6, however not far from patient's baseline.  Wet prep negative for clue cells, trichomoniasis, yeast, and WBCs.    Imaging Studies:  I ordered imaging studies including pelvic US. Pending at time of signout   Medicines ordered: Pt did not want pain medication at this time.  Pt signed out to [employer] for f/u on [place]. If nothing acute/dangerous, consider d/c with OB f/u.    Final Clinical Impression(s) / ED Diagnoses Final diagnoses:  None    Rx / DC Orders ED Discharge Orders     None         Alveria Apley, PA-C 03/15/21 1629    Terrilee Files, MD 03/16/21 [phone]

## 2021-03-16 LAB — CERVICOVAGINAL ANCILLARY ONLY
Bacterial Vaginitis (gardnerella): POSITIVE — AB
Candida Glabrata: NEGATIVE
Candida Vaginitis: NEGATIVE
Chlamydia: NEGATIVE
Comment: NEGATIVE
Comment: NEGATIVE
Comment: NEGATIVE
Comment: NEGATIVE
Comment: NEGATIVE
Comment: NORMAL
Neisseria Gonorrhea: NEGATIVE
Trichomonas: NEGATIVE

## 2021-03-16 LAB — GC/CHLAMYDIA PROBE AMP (~~LOC~~) NOT AT ARMC
Chlamydia: NEGATIVE
Comment: NEGATIVE
Comment: NORMAL
Neisseria Gonorrhea: NEGATIVE

## 2021-03-16 LAB — RPR: RPR Ser Ql: NONREACTIVE

## 2021-03-17 ENCOUNTER — Telehealth (HOSPITAL_COMMUNITY): Payer: Self-pay | Admitting: Emergency Medicine

## 2021-03-17 MED ORDER — METRONIDAZOLE 500 MG PO TABS: 500.0000 mg | ORAL_TABLET | Freq: Two times a day (BID) | ORAL | 0 refills | Status: DC

## 2021-03-24 ENCOUNTER — Ambulatory Visit: Payer: Self-pay | Admitting: Obstetrics & Gynecology

## 2021-03-26 ENCOUNTER — Telehealth: Payer: Self-pay | Admitting: Physician Assistant

## 2021-03-26 MED ORDER — FLUCONAZOLE 150 MG PO TABS: 150.0000 mg | ORAL_TABLET | Freq: Once | ORAL | 0 refills | Status: AC

## 2021-03-26 NOTE — Telephone Encounter (Signed)
Patient called concerned about possible yeast infection after antibiotic treatment for BV, would like rx for same. Rx sent to pharm as requested.

## 2021-07-14 ENCOUNTER — Emergency Department (HOSPITAL_COMMUNITY): Payer: Self-pay

## 2021-07-14 ENCOUNTER — Emergency Department (HOSPITAL_COMMUNITY)
Admission: EM | Admit: 2021-07-14 | Discharge: 2021-07-14 | Disposition: A | Discharge status: Home / Self Care | Payer: Self-pay | Attending: Emergency Medicine | Admitting: Emergency Medicine

## 2021-07-14 ENCOUNTER — Other Ambulatory Visit: Payer: Self-pay

## 2021-07-14 DIAGNOSIS — S93421A Sprain of deltoid ligament of right ankle, initial encounter: Secondary | ICD-10-CM | POA: Insufficient documentation

## 2021-07-14 DIAGNOSIS — W109XXA Fall (on) (from) unspecified stairs and steps, initial encounter: Secondary | ICD-10-CM | POA: Insufficient documentation

## 2021-07-14 NOTE — ED Triage Notes (Signed)
Pt. Stated, I twisted my rt. Ankle coming down steps around 705 this morning.I was not able to put weight on it.

## 2021-07-14 NOTE — Discharge Instructions (Signed)
Your x-ray was negative for fracture or dislocation.  You likely sprained your ankle, so I have given you a boot along with some crutches that you can use as needed for your own comfort.  At home, elevate the limb, apply ice 20 minutes on 20 minutes off over the next several days.  Take Tylenol Motrin for pain.  You should start feeling better in several weeks.

## 2021-07-14 NOTE — ED Provider Notes (Signed)
[facility] EMERGENCY DEPARTMENT Provider Note   CSN: [id number] Arrival date & time: 07/14/21  0736     History  Chief Complaint  Patient presents with   Ankle Pain    [name] is a 26 y.o. female who presents to the ED after twisting her ankle coming down her stairs today.  Patient patient states that she slipped, everting her ankle and falling on the stairs.  Denies head injury or loss of consciousness.  Currently complaining of pain on the medial ankle.  She reports being nonambulatory after the event due to pain.  No treatment prior to arrival.  She came straight to the emergency department.  Denies numbness, tingling.  She has no other systemic complaints.   Ankle Pain     Home Medications Prior to Admission medications   Medication Sig Start Date End Date Taking? Authorizing Provider  gabapentin (NEURONTIN) 100 MG capsule TAKE 2 CAPSULES BY MOUTH EVERY 12 HOURS. 01/02/20 01/01/21  Gaynelle Adu, MD  metroNIDAZOLE (FLAGYL) 500 MG tablet Take 1 tablet (500 mg total) by mouth 2 (two) times daily. 03/17/21   Merrilee Jansky, MD  pantoprazole (PROTONIX) 40 MG tablet TAKE 1 TABLET BY MOUTH DAILY. 01/02/20 01/01/21  Gaynelle Adu, MD  Vitamin D, Ergocalciferol, (DRISDOL) 1.25 MG (50000 UNIT) CAPS capsule Take 1 capsule (50,000 Units total) by mouth every 7 (seven) days. 03/05/20   Whitmire, Thermon Leyland, FNP      Allergies    Contrast media [iodinated contrast media] and Isovue [iopamidol]    Review of Systems   Review of Systems  Physical Exam Updated Vital Signs BP (!) 123/112 (BP Location: Left Arm)   Pulse 94   Temp 98.9 F (37.2 C) (Oral)   Resp 16   Ht 5\' 2"  (1.575 m)   Wt 108.9 kg   LMP 06/20/2021 (Exact Date)   SpO2 96%   BMI 43.90 kg/m  Physical Exam Vitals and nursing note reviewed.  Constitutional:      General: She is not in acute distress.    Appearance: She is not ill-appearing.  HENT:     Head: Atraumatic.  Eyes:      Conjunctiva/sclera: Conjunctivae normal.  Cardiovascular:     Rate and Rhythm: Normal rate and regular rhythm.     Pulses: Normal pulses.     Heart sounds: No murmur heard. Pulmonary:     Effort: Pulmonary effort is normal. No respiratory distress.     Breath sounds: Normal breath sounds.  Abdominal:     General: Abdomen is flat. There is no distension.     Palpations: Abdomen is soft.     Tenderness: There is no abdominal tenderness.  Musculoskeletal:     Cervical back: Normal range of motion.     Comments: TTP distal to medial malleous when pressing on deltoid ligament. Mild swelling. 2+ DP pulses. Can wiggle toes minimally d/t pain  Skin:    General: Skin is warm and dry.     Capillary Refill: Capillary refill takes less than 2 seconds.  Neurological:     General: No focal deficit present.     Mental Status: She is alert.  Psychiatric:        Mood and Affect: Mood normal.    ED Results / Procedures / Treatments   Labs (all labs ordered are listed, but only abnormal results are displayed) Labs Reviewed - No data to display  EKG None  Radiology DG Ankle Complete Right  Result Date:  07/14/2021 CLINICAL DATA:  Twisting injury.  Pain and swelling medially. EXAM: RIGHT ANKLE - COMPLETE 3+ VIEW COMPARISON:  01/23/2013 FINDINGS: No acute fracture or dislocation. Base of fifth metatarsal and talar dome intact. Tiny Achilles spur. IMPRESSION: No acute osseous abnormality. Electronically Signed   By: Jeronimo Greaves M.D.   On: 07/14/2021 08:46    Procedures Procedures    Medications Ordered in ED Medications - No data to display  ED Course/ Medical Decision Making/ A&P                           Medical Decision Making Amount and/or Complexity of Data Reviewed Radiology: ordered.   26 year old female presents to the ED for evaluation after an ankle injury that occurred just prior to arrival.  Vitals without significant abnormal bleeding.  Patient has tenderness noted just  distal to the medial malleolus when pressing on the deltoid ligament.  Mild associated tenderness without bruising.  Patient is otherwise neurovascularly intact.  I ordered and interpreted x-ray of the right ankle which was without acute fracture or abnormality.  Agree with radiologist interpretation.  Symptoms consistent with sprain of the deltoid ligament.  Patient provided boot along with crutches for comfort.  RICE protocol indicated.  Discharged home in stable condition. Final Clinical Impression(s) / ED Diagnoses Final diagnoses:  Sprain of deltoid ligament of right ankle, initial encounter    Rx / DC Orders ED Discharge Orders     None         Janell Quiet, PA-C 07/14/21 0913    Gloris Manchester, MD 07/19/21 1511

## 2021-07-14 NOTE — Progress Notes (Signed)
Orthopedic Tech Progress Note Patient Details:  [name] [birth year] [id number]  Ortho Devices Type of Ortho Device: CAM walker, Crutches Ortho Device/Splint Location: RLE Ortho Device/Splint Interventions: Ordered, Application, Adjustment   Post Interventions Patient Tolerated: Well, Ambulated well Instructions Provided: Care of device  [name] 07/14/2021, 10:34 AM

## 2021-07-24 ENCOUNTER — Encounter (HOSPITAL_COMMUNITY): Payer: Self-pay | Admitting: *Deleted

## 2021-09-23 ENCOUNTER — Encounter (INDEPENDENT_AMBULATORY_CARE_PROVIDER_SITE_OTHER): Payer: Self-pay

## 2021-11-30 ENCOUNTER — Encounter (INDEPENDENT_AMBULATORY_CARE_PROVIDER_SITE_OTHER): Payer: Self-pay | Admitting: Internal Medicine

## 2021-12-02 ENCOUNTER — Encounter (INDEPENDENT_AMBULATORY_CARE_PROVIDER_SITE_OTHER): Payer: Self-pay | Admitting: Family Medicine

## 2022-01-26 ENCOUNTER — Ambulatory Visit
Admission: EM | Admit: 2022-01-26 | Discharge: 2022-01-26 | Disposition: A | Discharge status: Home / Self Care | Payer: Self-pay | Attending: Emergency Medicine | Admitting: Emergency Medicine

## 2022-01-26 DIAGNOSIS — J3089 Other allergic rhinitis: Secondary | ICD-10-CM | POA: Insufficient documentation

## 2022-01-26 DIAGNOSIS — Z3202 Encounter for pregnancy test, result negative: Secondary | ICD-10-CM

## 2022-01-26 DIAGNOSIS — J302 Other seasonal allergic rhinitis: Secondary | ICD-10-CM | POA: Insufficient documentation

## 2022-01-26 DIAGNOSIS — B349 Viral infection, unspecified: Secondary | ICD-10-CM | POA: Insufficient documentation

## 2022-01-26 DIAGNOSIS — Z20822 Contact with and (suspected) exposure to covid-19: Secondary | ICD-10-CM | POA: Insufficient documentation

## 2022-01-26 DIAGNOSIS — J4521 Mild intermittent asthma with (acute) exacerbation: Secondary | ICD-10-CM | POA: Insufficient documentation

## 2022-01-26 LAB — RESP PANEL BY RT-PCR (FLU A&B, COVID) ARPGX2
Influenza A by PCR: NEGATIVE
Influenza B by PCR: NEGATIVE
SARS Coronavirus 2 by RT PCR: NEGATIVE

## 2022-01-26 LAB — POCT URINE PREGNANCY: Preg Test, Ur: NEGATIVE

## 2022-01-26 MED ORDER — ALBUTEROL SULFATE (2.5 MG/3ML) 0.083% IN NEBU: 2.5000 mg | INHALATION_SOLUTION | Freq: Four times a day (QID) | RESPIRATORY_TRACT | 2 refills | Status: DC | PRN

## 2022-01-26 MED ORDER — CETIRIZINE HCL 10 MG PO TABS: 10.0000 mg | ORAL_TABLET | Freq: Every day | ORAL | 1 refills | Status: DC

## 2022-01-26 MED ORDER — ALBUTEROL SULFATE HFA 108 (90 BASE) MCG/ACT IN AERS: 2.0000 | INHALATION_SPRAY | Freq: Four times a day (QID) | RESPIRATORY_TRACT | 2 refills | Status: DC | PRN

## 2022-01-26 MED ORDER — MONTELUKAST SODIUM 10 MG PO TABS: 10.0000 mg | ORAL_TABLET | Freq: Every day | ORAL | 1 refills | Status: DC

## 2022-01-26 MED ORDER — FLUTICASONE PROPIONATE 50 MCG/ACT NA SUSP: 1.0000 | Freq: Every day | NASAL | 1 refills | Status: DC

## 2022-01-26 MED ORDER — ALBUTEROL SULFATE (2.5 MG/3ML) 0.083% IN NEBU
2.5000 mg | INHALATION_SOLUTION | Freq: Once | RESPIRATORY_TRACT | Status: AC
  Administered 2022-01-26: 2.5 mg via RESPIRATORY_TRACT

## 2022-01-26 MED ORDER — DEXAMETHASONE SODIUM PHOSPHATE 10 MG/ML IJ SOLN
10.0000 mg | Freq: Once | INTRAMUSCULAR | Status: AC
  Administered 2022-01-26: 10 mg via INTRAMUSCULAR

## 2022-01-26 NOTE — Discharge Instructions (Addendum)
You received a COVID-19 and influenza PCR test today.  The results of your PCR testing will be posted to your MyChart once it is complete.  This typically takes 6 to 12 hours.     If either of your tests are is positive, you will be contacted by phone.  Due to the current duration of your symptoms, you will no longer benefit from antiviral therapy.  Because of your asthma flareup at this time, I do recommend that you be prescribed a 5-day course of dexamethasone 6 mg twice daily to keep your lungs calm.     Your symptoms and my physical exam findings are concerning for exacerbation of your underlying allergies and asthma.  I agree would be a good idea for you to reach out to your allergist to see if you need to resume therapy.  Please do reach out to your apartment manager to let them know of your history of allergies, if you can get documentation of your known allergies from your allergist that will help them know exactly what needs to be done to improve your apartment.   Please see the list below for recommended medications, dosages and frequencies to provide relief of current symptoms:     Decadron IM (dexamethasone):  To quickly address your significant respiratory inflammation, you were provided with an injection of Decadron in the office today.  You should continue to feel the full benefit of the steroid for the next 12-24 hours.    Zyrtec (cetirizine): This is an excellent second-generation antihistamine that helps to reduce respiratory inflammatory response to environmental allergens.  In some patients, this medication can cause daytime sleepiness so I recommend that you take 1 tablet daily at bedtime.   Singulair (montelukast): This is a mast cell stabilizer that works well with antihistamines.  Mast cells are responsible for stimulating histamine production so you can imagine that if we can reduce the activity of your mast cells, then fewer histamines will be produced and inflammation caused by  allergy exposure will be significantly reduced.  I recommend that you take this medication at the same time you take your antihistamine.   Flonase (fluticasone): This is a steroid nasal spray that you use once daily, 1 spray in each nare.  This medication does not work well if you decide to use it only used as you feel you need to, it works best used on a daily basis.  After 3 to 5 days of use, you will notice significant reduction of the inflammation and mucus production that is currently being caused by exposure to allergens, whether seasonal or environmental.  The most common side effect of this medication is nosebleeds.  If you experience a nosebleed, please discontinue use for 1 week, then feel free to resume.  I have provided you with a prescription.     ProAir, Ventolin, Proventil (albuterol): This inhaled medication contains a short acting beta agonist bronchodilator.  This medication works on the smooth muscle that opens and constricts of your airways by relaxing the muscle.  The result of relaxation of the smooth muscle is increased air movement and improved work of breathing.  This is a short acting medication that can be used every 4-6 hours as needed for increased work of breathing, shortness of breath, wheezing and excessive coughing.  I have provided you with a prescription for both the handheld inhaler and the nebulizer solution.  For the next week or so, please use albuterol in the morning in the evening and  again anytime throughout the day that you feel short of breath or have a persistent cough.  After that, you can go back to using it just as needed.   Advil, Motrin (ibuprofen): This is a good anti-inflammatory medication which not only addresses aches, pains but also significantly reduces soft tissue inflammation of the upper airways that causes sinus and nasal congestion as well as inflammation of the lower airways which makes you feel like your breathing is constricted or your cough feel  tight.  I recommend that you take 400 mg every 8 hours as needed.      If you find that you have not had improvement of your symptoms in the next 5 to 7 days, please follow-up with your primary care provider or return here to urgent care for repeat evaluation and further recommendations.   Thank you for visiting urgent care today.  We appreciate the opportunity to participate in your care.

## 2022-01-26 NOTE — ED Triage Notes (Addendum)
Pt c/o runny nose, chest congestion and throat discomfort. The patient states she is a Runner, broadcasting/film/video.   Started: Saturday but got worse this morning   Home interventions: mucinex  The patient states she is also 3 days late on her menstrual cycle (cycles have been regular this year-PCOS).

## 2022-01-26 NOTE — ED Provider Notes (Signed)
[facility]    CSN: [id number] Arrival date & time: 01/26/22  [id number]    HISTORY   Chief Complaint  Patient presents with   Sore Throat   Nasal Congestion   HPI [name] is a pleasant, 26 y.o. female who presents to urgent care today. Pt c/o clear rhinorrhea, nasal congestion, chest congestion and throat irritation. The patient states she is a Pharmacist, hospital.  Patient reports a history of allergies and asthma, typically worse in the spring so she is not currently taking any allergy medication or using any inhalers at this time.  Patient states her symptoms began 4 days ago but got significantly worse this morning.  Patient states has been taking Mucinex without relief.  Patient also reports being 3 days late for her menstrual cycle, reports a history of PCOS but states her periods are always regular.  Urine pregnancy test today is negative.  Patient advised.  Patient denies pain with swallowing, throat swelling, difficulty maintaining secretions, difficulty maintaining airway.  Patient states the cough is not productive.  Patient denies nausea, vomiting, diarrhea, headache, body aches, chills, fever.  Patient has normal vital signs on arrival today.  The history is provided by the patient.   Past Medical History:  Diagnosis Date   Anemia    iron   Asthma    spring   Back pain    Joint pain    Lower extremity edema    PCOS (polycystic ovarian syndrome)    Pre-diabetes    Prediabetes    Seasonal allergies    SOB (shortness of breath)    Patient Active Problem List   Diagnosis Date Noted   Status post laparoscopic sleeve gastrectomy (01/01/20) 03/09/2020   Mild obstructive sleep apnea 01/01/2020   Vitamin D deficiency 09/04/2019   PCOS (polycystic ovarian syndrome) 09/04/2019   Depression 09/04/2019   Class 3 severe obesity with serious comorbidity and body mass index (BMI) of 45.0 to 49.9 in adult [facility]) 09/04/2019   Prediabetes 09/04/2019   Absolute anemia  09/04/2019   Past Surgical History:  Procedure Laterality Date   FEMUR FRACTURE SURGERY Right 2010   LAPAROSCOPIC GASTRIC SLEEVE RESECTION N/A 01/01/2020   Procedure: LAPAROSCOPIC GASTRIC SLEEVE RESECTION;  Surgeon: Greer Pickerel, MD;  Location: [facility];  Service: General;  Laterality: N/A;   UPPER GI ENDOSCOPY N/A 01/01/2020   Procedure: UPPER GI ENDOSCOPY;  Surgeon: Greer Pickerel, MD;  Location: [facility];  Service: General;  Laterality: N/A;   OB History     Gravida  0   Para  0   Term  0   Preterm  0   AB  0   Living  0      SAB  0   IAB  0   Ectopic  0   Multiple  0   Live Births             Home Medications    Prior to Admission medications   Medication Sig Start Date End Date Taking? Authorizing Provider  gabapentin (NEURONTIN) 100 MG capsule TAKE 2 CAPSULES BY MOUTH EVERY 12 HOURS. 01/02/20 01/01/21  Greer Pickerel, MD  metroNIDAZOLE (FLAGYL) 500 MG tablet Take 1 tablet (500 mg total) by mouth 2 (two) times daily. 03/17/21   Chase Picket, MD  pantoprazole (PROTONIX) 40 MG tablet TAKE 1 TABLET BY MOUTH DAILY. 01/02/20 01/01/21  Greer Pickerel, MD  Vitamin D, Ergocalciferol, (DRISDOL) 1.25 MG (50000 UNIT) CAPS capsule Take 1 capsule (50,000 Units total) by  mouth every 7 (seven) days. 03/05/20   Whitmire, Thermon Leyland, FNP    Family History Family History  Problem Relation Age of Onset   Diabetes Mother    Diabetes Father    Hypertension Father    Sleep apnea Father    Obesity Father    Cancer Other    Social History Social History   Tobacco Use   Smoking status: Never   Smokeless tobacco: Never  Vaping Use   Vaping Use: Never used  Substance Use Topics   Alcohol use: Yes    Comment: occ   Drug use: No   Allergies   Contrast media [iodinated contrast media] and Isovue [iopamidol]  Review of Systems Review of Systems Pertinent findings revealed after performing a 14 point review of systems has been noted in the history of present  illness.  Physical Exam Triage Vital Signs ED Triage Vitals  Enc Vitals Group     BP 12/12/20 0827 (!) 147/82     Pulse Rate 12/12/20 0827 72     Resp 12/12/20 0827 18     Temp 12/12/20 0827 98.3 F (36.8 C)     Temp Source 12/12/20 0827 Oral     SpO2 12/12/20 0827 98 %     Weight --      Height --      Head Circumference --      Peak Flow --      Pain Score 12/12/20 0826 5     Pain Loc --      Pain Edu? --      Excl. in GC? --   No data found.  Updated Vital Signs BP 109/78 (BP Location: Left Arm)   Pulse 82   Temp 98.1 F (36.7 C) (Oral)   Resp 18   LMP 12/23/2021   SpO2 98%   Physical Exam Vitals and nursing note reviewed.  Constitutional:      General: She is not in acute distress.    Appearance: Normal appearance. She is not ill-appearing.  HENT:     Head: Normocephalic and atraumatic.     Salivary Glands: Right salivary gland is not diffusely enlarged or tender. Left salivary gland is not diffusely enlarged or tender.     Right Ear: Ear canal and external ear normal. No drainage. A middle ear effusion is present. There is no impacted cerumen. Tympanic membrane is bulging. Tympanic membrane is not injected or erythematous.     Left Ear: Ear canal and external ear normal. No drainage. A middle ear effusion is present. There is no impacted cerumen. Tympanic membrane is bulging. Tympanic membrane is not injected or erythematous.     Ears:     Comments: Bilateral EACs normal, both TMs bulging with clear fluid    Nose: Rhinorrhea present. No nasal deformity, septal deviation, signs of injury, nasal tenderness, mucosal edema or congestion. Rhinorrhea is clear.     Right Nostril: Occlusion present. No foreign body, epistaxis or septal hematoma.     Left Nostril: Occlusion present. No foreign body, epistaxis or septal hematoma.     Right Turbinates: Enlarged, swollen and pale.     Left Turbinates: Enlarged, swollen and pale.     Right Sinus: No maxillary sinus tenderness  or frontal sinus tenderness.     Left Sinus: No maxillary sinus tenderness or frontal sinus tenderness.     Mouth/Throat:     Lips: Pink. No lesions.     Mouth: Mucous membranes are moist. No oral lesions.  Pharynx: Oropharynx is clear. Uvula midline. No posterior oropharyngeal erythema or uvula swelling.     Tonsils: No tonsillar exudate. 0 on the right. 0 on the left.     Comments: Postnasal drip Eyes:     General: Lids are normal.        Right eye: No discharge.        Left eye: No discharge.     Extraocular Movements: Extraocular movements intact.     Conjunctiva/sclera: Conjunctivae normal.     Right eye: Right conjunctiva is not injected.     Left eye: Left conjunctiva is not injected.  Neck:     Trachea: Trachea and phonation normal.  Cardiovascular:     Rate and Rhythm: Normal rate and regular rhythm.     Pulses: Normal pulses.     Heart sounds: Normal heart sounds. No murmur heard.    No friction rub. No gallop.  Pulmonary:     Effort: Pulmonary effort is normal. No accessory muscle usage, prolonged expiration or respiratory distress.     Breath sounds: No stridor, decreased air movement or transmitted upper airway sounds. Examination of the right-upper field reveals decreased breath sounds. Examination of the left-upper field reveals decreased breath sounds. Examination of the right-middle field reveals decreased breath sounds. Examination of the left-middle field reveals decreased breath sounds. Examination of the right-lower field reveals decreased breath sounds. Examination of the left-lower field reveals decreased breath sounds. Decreased breath sounds present. No wheezing, rhonchi or rales.  Chest:     Chest wall: No tenderness.  Musculoskeletal:        General: Normal range of motion.     Cervical back: Normal range of motion and neck supple. Normal range of motion.  Lymphadenopathy:     Cervical: No cervical adenopathy.  Skin:    General: Skin is warm and dry.      Findings: No erythema or rash.  Neurological:     General: No focal deficit present.     Mental Status: She is alert and oriented to person, place, and time.  Psychiatric:        Mood and Affect: Mood normal.        Behavior: Behavior normal.     Visual Acuity Right Eye Distance:   Left Eye Distance:   Bilateral Distance:    Right Eye Near:   Left Eye Near:    Bilateral Near:     UC Couse / Diagnostics / Procedures:     Radiology No results found.  Procedures Procedures (including critical care time) EKG  Pending results:  Labs Reviewed  RESP PANEL BY RT-PCR (FLU A&B, COVID) [id number]  POCT URINE PREGNANCY    Medications Ordered in UC: Medications  dexamethasone (DECADRON) injection 10 mg (has no administration in time range)  albuterol (PROVENTIL) (2.5 MG/3ML) 0.083% nebulizer solution 2.5 mg (2.5 mg Nebulization Given 01/26/22 1135)    UC Diagnoses / Final Clinical Impressions(s)   I have reviewed the triage vital signs and the nursing notes.  Pertinent labs & imaging results that were available during my care of the patient were reviewed by me and considered in my medical decision making (see chart for details).    Final diagnoses:  Viral illness  Mild intermittent asthma with acute exacerbation in adult  Perennial allergic rhinitis with seasonal variation  Repeat auscultation post nebulized bronchodilator revealed significant improvement of breath sounds and work of breathing.  No wheezing was appreciated on exam. Patient states she has a nebulizer machine  at home and is requesting albuterol solution.  Patient provided with prescription for [facility] as well.  Recommend patient resume allergy medication Zyrtec and Flonase.  Singulair also prescribed given history of asthma.  Return precautions advised. Please see discharge instructions below for further details of plan of care as provided to patient. ED Prescriptions     Medication Sig Dispense Auth. Provider    cetirizine (ZYRTEC ALLERGY) 10 MG tablet Take 1 tablet (10 mg total) by mouth at bedtime. 90 tablet Lynden Oxford Scales, PA-C   fluticasone (FLONASE) 50 MCG/ACT nasal spray Place 1 spray into both nostrils daily. 47.4 mL Lynden Oxford Scales, PA-C   albuterol (PROVENTIL) (2.5 MG/3ML) 0.083% nebulizer solution Take 3 mLs (2.5 mg total) by nebulization every 6 (six) hours as needed for wheezing or shortness of breath. 360 mL Lynden Oxford Scales, PA-C   albuterol (VENTOLIN HFA) 108 (90 Base) MCG/ACT inhaler Inhale 2 puffs into the lungs every 6 (six) hours as needed for wheezing or shortness of breath (Cough). 36 g Lynden Oxford Scales, PA-C   montelukast (SINGULAIR) 10 MG tablet Take 1 tablet (10 mg total) by mouth at bedtime. 90 tablet Lynden Oxford Scales, [place]      PDMP not reviewed this encounter.  Disposition Upon Discharge:  Condition: stable for discharge home Home: take medications as prescribed; routine discharge instructions as discussed; follow up as advised.  Patient presented with an acute illness with associated systemic symptoms and significant discomfort requiring urgent management. In my opinion, this is a condition that a prudent lay person (someone who possesses an average knowledge of health and medicine) may potentially expect to result in complications if not addressed urgently such as respiratory distress, impairment of bodily function or dysfunction of bodily organs.   Routine symptom specific, illness specific and/or disease specific instructions were discussed with the patient and/or caregiver at length.   As such, the patient has been evaluated and assessed, work-up was performed and treatment was provided in alignment with urgent care protocols and evidence based medicine.  Patient/parent/caregiver has been advised that the patient may require follow up for further testing and treatment if the symptoms continue in spite of treatment, as clinically indicated and  appropriate.  If the patient was tested for COVID-19, Influenza and/or RSV, then the patient/parent/guardian was advised to isolate at home pending the results of his/her diagnostic coronavirus test and potentially longer if they're positive. I have also advised pt that if his/her COVID-19 test returns positive, it's recommended to self-isolate for at least 10 days after symptoms first appeared AND until fever-free for 24 hours without fever reducer AND other symptoms have improved or resolved. Discussed self-isolation recommendations as well as instructions for household member/close contacts as per the [facility] and [place] [facility], and also gave patient the [place] packet with this information.  Patient/parent/caregiver has been advised to return to the [facility] or PCP in 3-5 days if no better; to PCP or the Emergency Department if new signs and symptoms develop, or if the current signs or symptoms continue to change or worsen for further workup, evaluation and treatment as clinically indicated and appropriate  The patient will follow up with their current PCP if and as advised. If the patient does not currently have a PCP we will assist them in obtaining one.   The patient may need specialty follow up if the symptoms continue, in spite of conservative treatment and management, for further workup, evaluation, consultation and treatment as clinically indicated and appropriate.  Patient/parent/caregiver verbalized  understanding and agreement of plan as discussed.  All questions were addressed during visit.  Please see discharge instructions below for further details of plan.  Discharge Instructions:   Discharge Instructions      You received a COVID-19 and influenza PCR test today.  The results of your PCR testing will be posted to your MyChart once it is complete.  This typically takes 6 to 12 hours.     If either of your tests are is positive, you will be contacted by phone.  Due to the current duration of your  symptoms, you will no longer benefit from antiviral therapy.  Because of your asthma flareup at this time, I do recommend that you be prescribed a 5-day course of dexamethasone 6 mg twice daily to keep your lungs calm.     Your symptoms and my physical exam findings are concerning for exacerbation of your underlying allergies and asthma.  I agree would be a good idea for you to reach out to your allergist to see if you need to resume therapy.  Please do reach out to your apartment manager to let them know of your history of allergies, if you can get documentation of your known allergies from your allergist that will help them know exactly what needs to be done to improve your apartment.   Please see the list below for recommended medications, dosages and frequencies to provide relief of current symptoms:     Decadron IM (dexamethasone):  To quickly address your significant respiratory inflammation, you were provided with an injection of Decadron in the office today.  You should continue to feel the full benefit of the steroid for the next 12-24 hours.    Zyrtec (cetirizine): This is an excellent second-generation antihistamine that helps to reduce respiratory inflammatory response to environmental allergens.  In some patients, this medication can cause daytime sleepiness so I recommend that you take 1 tablet daily at bedtime.   Singulair (montelukast): This is a mast cell stabilizer that works well with antihistamines.  Mast cells are responsible for stimulating histamine production so you can imagine that if we can reduce the activity of your mast cells, then fewer histamines will be produced and inflammation caused by allergy exposure will be significantly reduced.  I recommend that you take this medication at the same time you take your antihistamine.   Flonase (fluticasone): This is a steroid nasal spray that you use once daily, 1 spray in each nare.  This medication does not work well if you decide  to use it only used as you feel you need to, it works best used on a daily basis.  After 3 to 5 days of use, you will notice significant reduction of the inflammation and mucus production that is currently being caused by exposure to allergens, whether seasonal or environmental.  The most common side effect of this medication is nosebleeds.  If you experience a nosebleed, please discontinue use for 1 week, then feel free to resume.  I have provided you with a prescription.     ProAir, Ventolin, Proventil (albuterol): This inhaled medication contains a short acting beta agonist bronchodilator.  This medication works on the smooth muscle that opens and constricts of your airways by relaxing the muscle.  The result of relaxation of the smooth muscle is increased air movement and improved work of breathing.  This is a short acting medication that can be used every 4-6 hours as needed for increased work of breathing, shortness of breath,  wheezing and excessive coughing.  I have provided you with a prescription for both the handheld inhaler and the nebulizer solution.  For the next week or so, please use albuterol in the morning in the evening and again anytime throughout the day that you feel short of breath or have a persistent cough.  After that, you can go back to using it just as needed.   Advil, Motrin (ibuprofen): This is a good anti-inflammatory medication which not only addresses aches, pains but also significantly reduces soft tissue inflammation of the upper airways that causes sinus and nasal congestion as well as inflammation of the lower airways which makes you feel like your breathing is constricted or your cough feel tight.  I recommend that you take 400 mg every 8 hours as needed.      If you find that you have not had improvement of your symptoms in the next 5 to 7 days, please follow-up with your primary care provider or return here to urgent care for repeat evaluation and further  recommendations.   Thank you for visiting urgent care today.  We appreciate the opportunity to participate in your care.       This office note has been dictated using Museum/gallery curator.  Unfortunately, this method of dictation can sometimes lead to typographical or grammatical errors.  I apologize for your inconvenience in advance if this occurs.  Please do not hesitate to reach out to me if clarification is needed.      Lynden Oxford Scales, PA-C 01/28/22 2049

## 2022-02-23 ENCOUNTER — Encounter (INDEPENDENT_AMBULATORY_CARE_PROVIDER_SITE_OTHER): Payer: Self-pay | Admitting: Internal Medicine

## 2022-03-03 ENCOUNTER — Encounter (INDEPENDENT_AMBULATORY_CARE_PROVIDER_SITE_OTHER): Payer: Self-pay | Admitting: Internal Medicine

## 2022-03-15 IMAGING — US US PELVIS COMPLETE WITH TRANSVAGINAL
1 series · 13 of 25 positions shown · non-contrast
Comparison: 07/17/2015
COMPARISON: 07/17/2015

Addendum:
CLINICAL DATA: Vaginal bleeding, left pelvic pain

EXAM:
TRANSABDOMINAL AND TRANSVAGINAL ULTRASOUND OF PELVIS
DOPPLER ULTRASOUND OF OVARIES
TECHNIQUE: Both transabdominal and transvaginal ultrasound examinations of the
pelvis were performed. Transabdominal technique was performed for
global imaging of the pelvis including uterus, ovaries, adnexal
regions, and pelvic cul-de-sac.
It was necessary to proceed with endovaginal exam following the
transabdominal exam to visualize the ovaries. Color and duplex
Doppler ultrasound was utilized to evaluate blood flow to the
ovaries.

[Series 1: us pelvis (transabdominal only) · 13 of 104 slices shown]
[im 1/104]
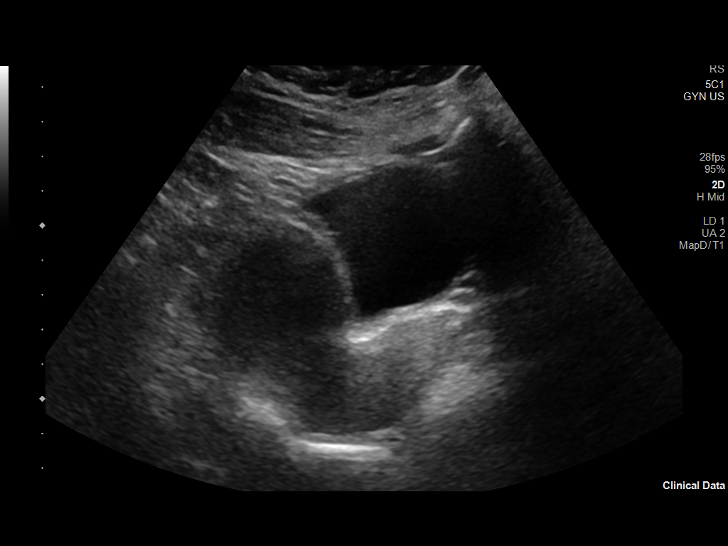
[im 9/104]
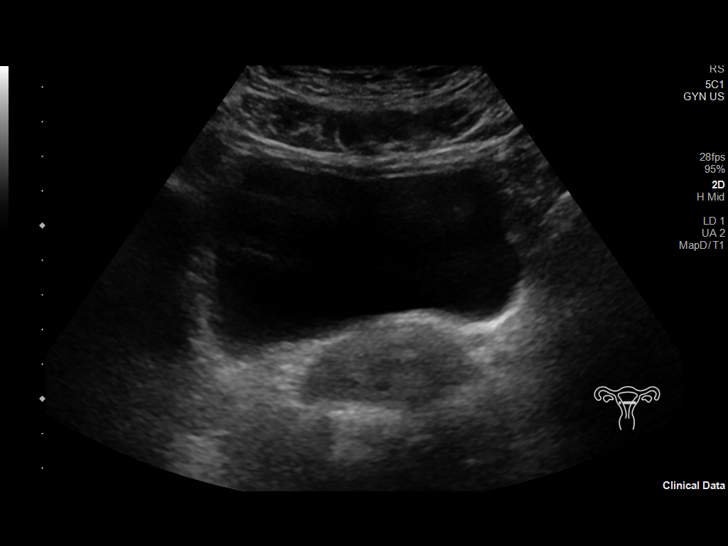
[im 18/104]
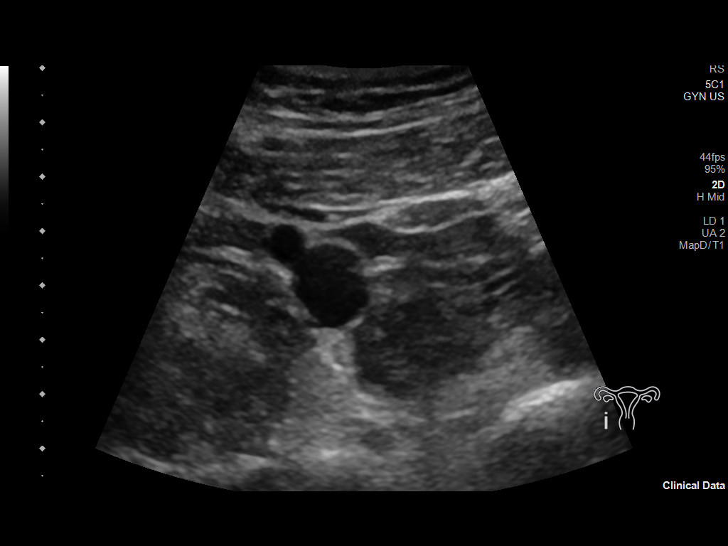
[im 26/104]
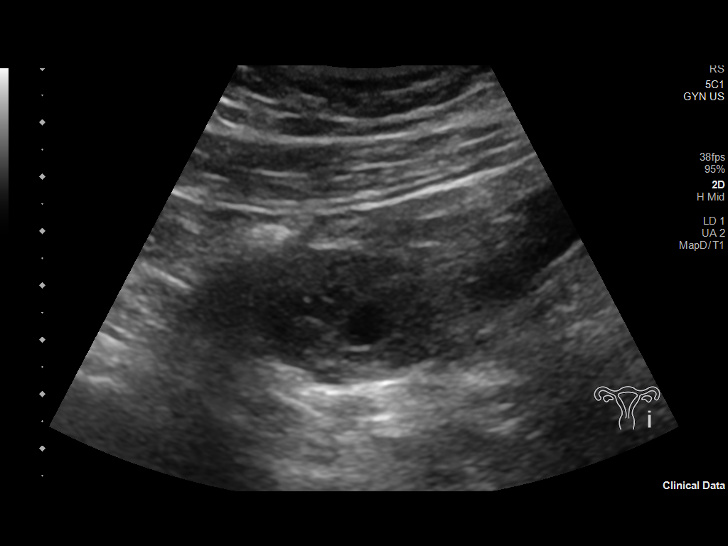
[im 35/104]
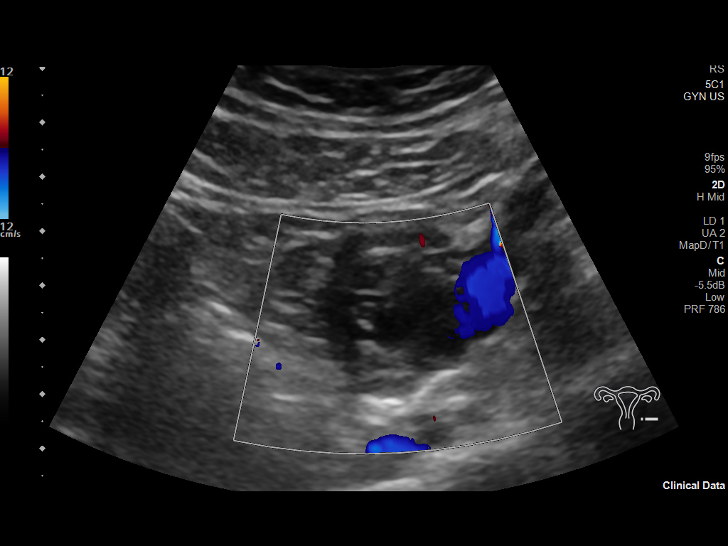
[im 43/104]
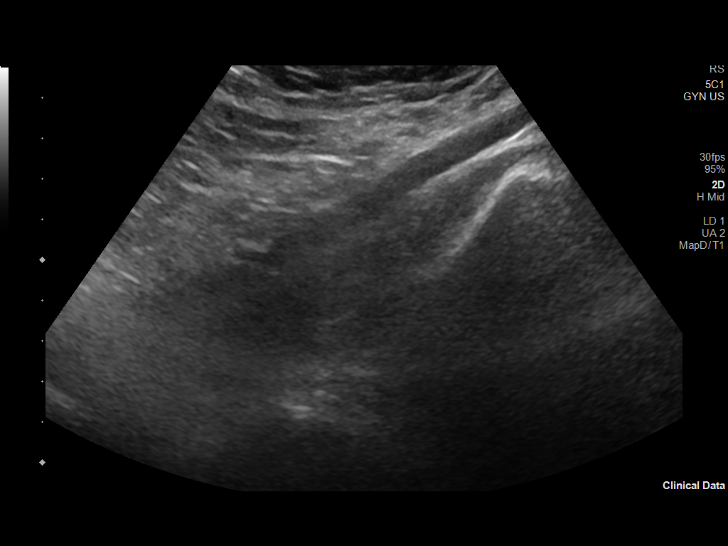
[im 52/104]
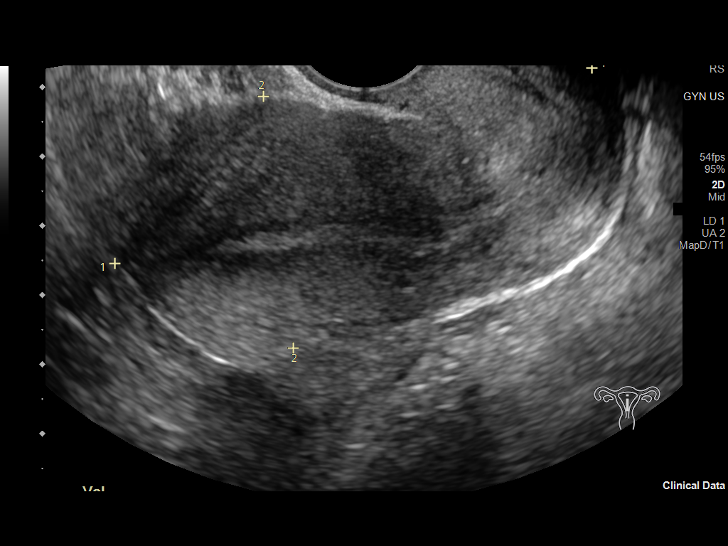
[im 61/104]
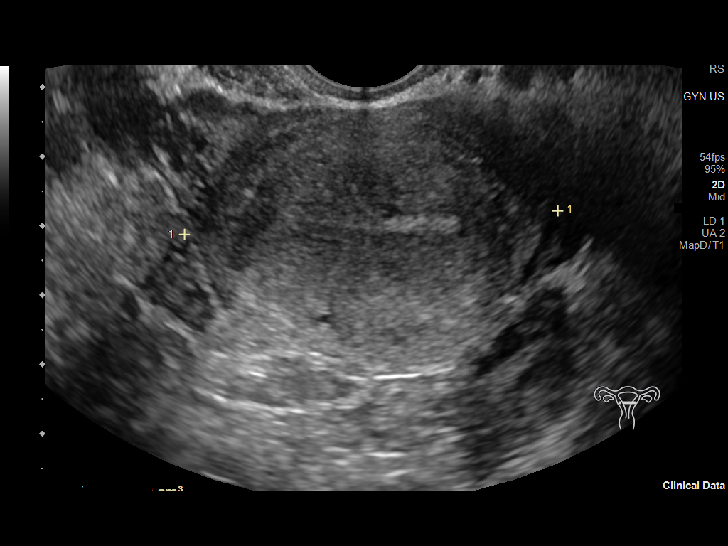
[im 69/104]
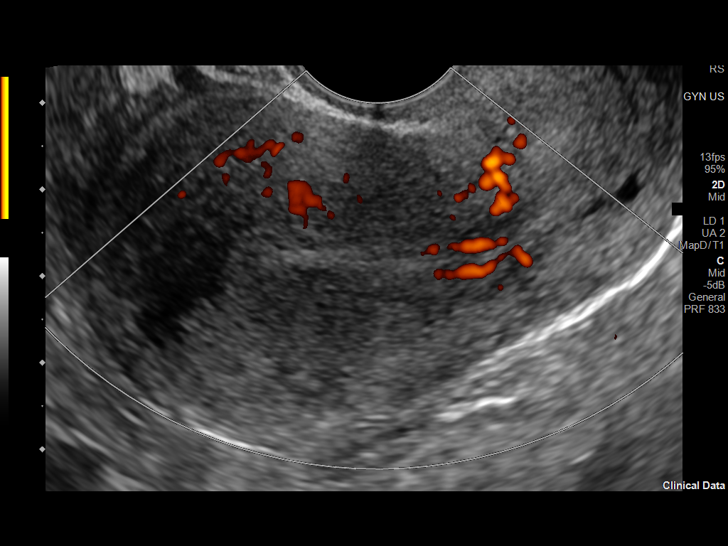
[im 78/104]
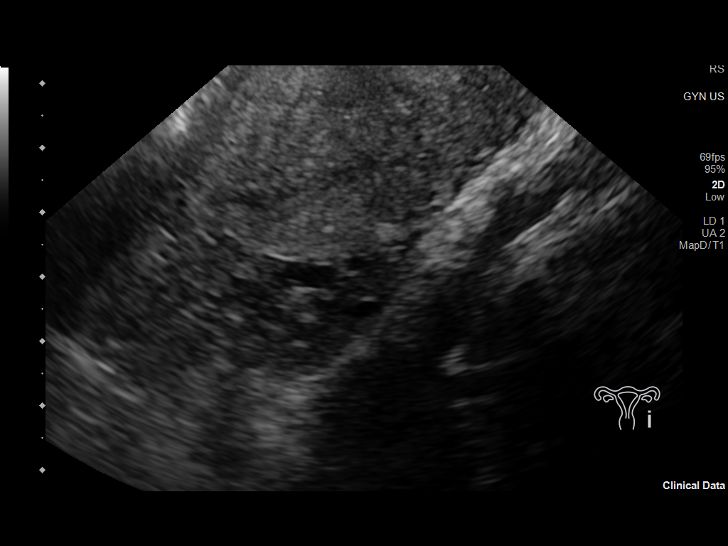
[im 86/104]
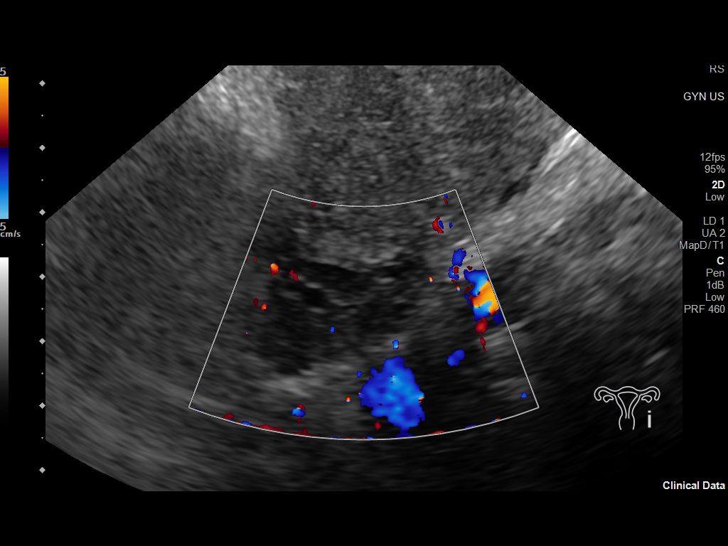
[im 95/104]
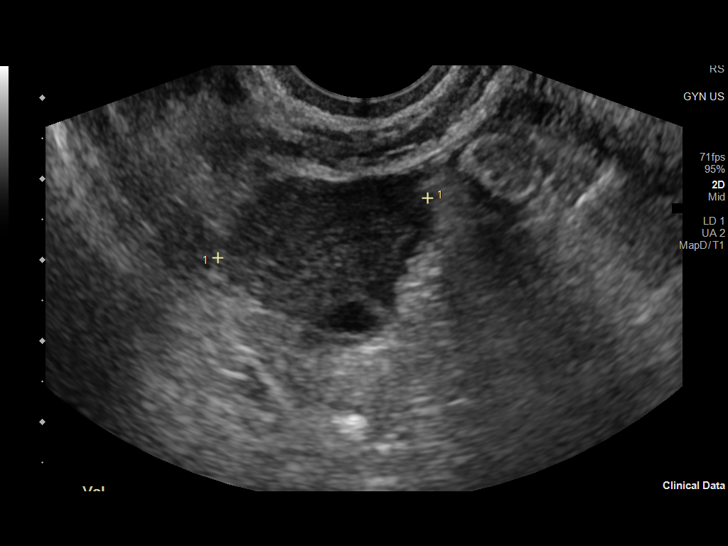
[im 104/104]
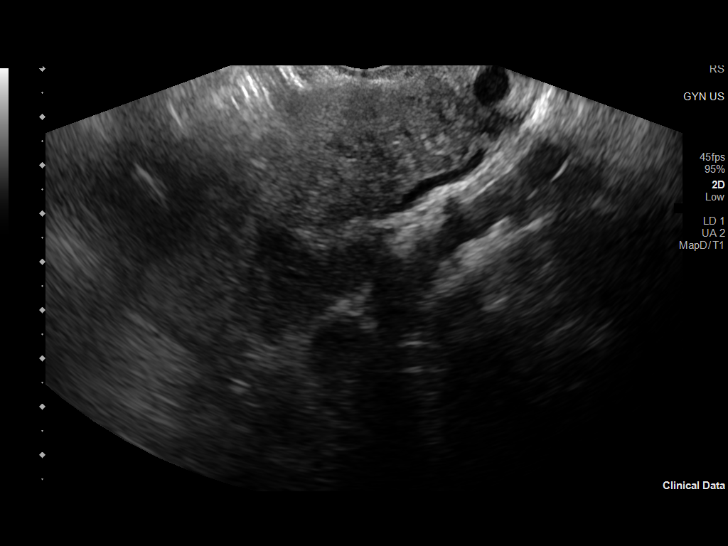

[13 of 25 positions shown; findings below may reference images not displayed]

FINDINGS: Uterus

Measurements: 7.9 x 4.5 x 4.9 cm = volume: 91.6 mL. No fibroids or
other mass visualized.

Endometrium

Thickness: 8 mm.  No focal abnormality visualized.

Right ovary

Measurements: 2.5 x 2 x 2.7 cm = volume: 7.2 mL. Subcentimeter
follicles are seen.

Left ovary

Measurements: 3.1 x 2.1 x 2 cm = volume: 7 mL. Subcentimeter
follicles are seen.

Pulsed Doppler evaluation of both ovaries demonstrates normal
low-resistance arterial and venous waveforms.

Other findings

Trace amount of free fluid is seen in the left adnexa.
IMPRESSION: Trace amount of free fluid in the left adnexa may suggest recent
rupture of ovarian cyst or follicle. Pelvic sonogram is otherwise
unremarkable.

ADDENDUM:
This addendum is made to clarify the Doppler technique used for the
study. Color flow Doppler examination was done. Pulsed Doppler
examination was not performed.

*** End of Addendum ***
FINDINGS: Uterus

Measurements: 7.9 x 4.5 x 4.9 cm = volume: 91.6 mL. No fibroids or
other mass visualized.

Endometrium

Thickness: 8 mm.  No focal abnormality visualized.

Right ovary

Measurements: 2.5 x 2 x 2.7 cm = volume: 7.2 mL. Subcentimeter
follicles are seen.

Left ovary

Measurements: 3.1 x 2.1 x 2 cm = volume: 7 mL. Subcentimeter
follicles are seen.

Pulsed Doppler evaluation of both ovaries demonstrates normal
low-resistance arterial and venous waveforms.

Other findings

Trace amount of free fluid is seen in the left adnexa.
IMPRESSION: Trace amount of free fluid in the left adnexa may suggest recent
rupture of ovarian cyst or follicle. Pelvic sonogram is otherwise
unremarkable.

## 2022-04-28 ENCOUNTER — Ambulatory Visit: Payer: BC Managed Care – PPO | Admitting: Family Medicine

## 2022-04-28 ENCOUNTER — Encounter: Payer: Self-pay | Admitting: Family Medicine

## 2022-04-28 VITALS — BP 144/77 | HR 77 | Temp 97.3°F | Resp 20 | Ht 62.0 in | Wt 256.9 lb

## 2022-04-28 DIAGNOSIS — Z7689 Persons encountering health services in other specified circumstances: Secondary | ICD-10-CM

## 2022-04-28 NOTE — Progress Notes (Signed)
New Patient Office Visit  Subjective    Patient ID: [name], female    DOB: 10-Feb-1996  Age: 27 y.o. MRN: [id number]  CC:  Chief Complaint  Patient presents with   Establish Care    Patient is here to establish care with a new provider and would like to discuss weight loss    HPI [name] presents to establish care. Pt is new to me.  Pt reports a hx of weight loss surgery in 2021. She use to see [facility] in [place]. She reports they told her that they didn't have any openings and was told to come here for referral for [facility] location. She has hx of PCOS. She says her periods have been more regular now since her weight loss surgery. She does report heavy menses and lasts up to 7 days. LMP a week ago. Declines flu vaccine. She has hx of asthma. Using zyrtec prn, albuterol inhaler and nebulizer. She also has seasonal allergies.   Outpatient Encounter Medications as of 04/28/2022  Medication Sig   albuterol (PROVENTIL) (2.5 MG/3ML) 0.083% nebulizer solution Take 3 mLs (2.5 mg total) by nebulization every 6 (six) hours as needed for wheezing or shortness of breath.   albuterol (VENTOLIN HFA) 108 (90 Base) MCG/ACT inhaler Inhale 2 puffs into the lungs every 6 (six) hours as needed for wheezing or shortness of breath (Cough).   cetirizine (ZYRTEC ALLERGY) 10 MG tablet Take 1 tablet (10 mg total) by mouth at bedtime.   fluticasone (FLONASE) 50 MCG/ACT nasal spray Place 1 spray into both nostrils daily.   gabapentin (NEURONTIN) 100 MG capsule TAKE 2 CAPSULES BY MOUTH EVERY 12 HOURS.   montelukast (SINGULAIR) 10 MG tablet Take 1 tablet (10 mg total) by mouth at bedtime.   pantoprazole (PROTONIX) 40 MG tablet TAKE 1 TABLET BY MOUTH DAILY.   Vitamin D, Ergocalciferol, (DRISDOL) 1.25 MG (50000 UNIT) CAPS capsule Take 1 capsule (50,000 Units total) by mouth every 7 (seven) days.   No facility-administered encounter medications on file as of 04/28/2022.     Past Medical History:  Diagnosis Date   Anemia    iron   Asthma    spring   Back pain    Joint pain    Lower extremity edema    PCOS (polycystic ovarian syndrome)    Pre-diabetes    Prediabetes    Seasonal allergies    SOB (shortness of breath)     Past Surgical History:  Procedure Laterality Date   FEMUR FRACTURE SURGERY Right 2010   LAPAROSCOPIC GASTRIC SLEEVE RESECTION N/A 01/01/2020   Procedure: LAPAROSCOPIC GASTRIC SLEEVE RESECTION;  Surgeon: Greer Pickerel, MD;  Location: [facility];  Service: General;  Laterality: N/A;   UPPER GI ENDOSCOPY N/A 01/01/2020   Procedure: UPPER GI ENDOSCOPY;  Surgeon: Greer Pickerel, MD;  Location: [facility];  Service: General;  Laterality: N/A;    Family History  Problem Relation Age of Onset   Diabetes Mother    Diabetes Father    Hypertension Father    Sleep apnea Father    Obesity Father    Cancer Other     Social History   Socioeconomic History   Marital status: Single    Spouse name: Not on file   Number of children: Not on file   Years of education: Not on file   Highest education level: Not on file  Occupational History   Occupation: Control and instrumentation engineer   Occupation: full time Ship broker  Tobacco Use   Smoking status: Never    Passive exposure: Never   Smokeless tobacco: Never  Vaping Use   Vaping Use: Never used  Substance and Sexual Activity   Alcohol use: Yes    Comment: socially   Drug use: No   Sexual activity: Yes    Birth control/protection: None  Other Topics Concern   Not on file  Social History Narrative   Not on file   Social Determinants of Health   Financial Resource Strain: Not on file  Food Insecurity: Not on file  Transportation Needs: Not on file  Physical Activity: Not on file  Stress: Not on file  Social Connections: Not on file  Intimate Partner Violence: Not on file    Review of Systems  Constitutional:        Weight gain  All other systems reviewed and are negative.      Objective     BP (!) 144/77   Pulse 77   Temp (!) 97.3 F (36.3 C) (Oral)   Resp 20   Ht '5\' 2"'$  (1.575 m)   Wt 256 lb 14.4 oz (116.5 kg)   SpO2 100%   BMI 46.99 kg/m   Physical Exam Vitals and nursing note reviewed.  Constitutional:      Appearance: Normal appearance. She is obese.  HENT:     Head: Normocephalic and atraumatic.     Right Ear: Tympanic membrane, ear canal and external ear normal.     Left Ear: Tympanic membrane, ear canal and external ear normal.     Nose: Nose normal.     Mouth/Throat:     Mouth: Mucous membranes are moist.     Pharynx: Oropharynx is clear.  Eyes:     Conjunctiva/sclera: Conjunctivae normal.     Pupils: Pupils are equal, round, and reactive to light.  Cardiovascular:     Rate and Rhythm: Normal rate and regular rhythm.  Pulmonary:     Effort: Pulmonary effort is normal.  Abdominal:     General: Abdomen is flat. Bowel sounds are normal.  Musculoskeletal:     Cervical back: Normal range of motion.  Skin:    General: Skin is warm.     Capillary Refill: Capillary refill takes less than 2 seconds.  Neurological:     General: No focal deficit present.     Mental Status: She is alert and oriented to person, place, and time. Mental status is at baseline.  Psychiatric:        Mood and Affect: Mood normal.        Behavior: Behavior normal.        Thought Content: Thought content normal.        Judgment: Judgment normal.       Assessment & Plan:   Problem List Items Addressed This Visit   None  Encounter to establish care with new doctor  Class 3 severe obesity due to excess calories with serious comorbidity and body mass index (BMI) of 45.0 to 49.9 in adult ([place]) -     Amb Ref to Medical Weight Management  Refer to [facility] for management of weight loss and treatment options See back in 6 weeks for annual and pap  No follow-ups on file.   Leeanne Rio, MD

## 2022-05-18 ENCOUNTER — Ambulatory Visit
Admission: EM | Admit: 2022-05-18 | Discharge: 2022-05-18 | Disposition: A | Discharge status: Home / Self Care | Payer: BC Managed Care – PPO | Attending: Family Medicine | Admitting: Family Medicine

## 2022-05-18 ENCOUNTER — Encounter: Payer: Self-pay | Admitting: Emergency Medicine

## 2022-05-18 DIAGNOSIS — J020 Streptococcal pharyngitis: Secondary | ICD-10-CM | POA: Diagnosis not present

## 2022-05-18 DIAGNOSIS — J351 Hypertrophy of tonsils: Secondary | ICD-10-CM

## 2022-05-18 LAB — POC INFLUENZA A AND B ANTIGEN (URGENT CARE ONLY)
Influenza A Ag: NEGATIVE
Influenza B Ag: NEGATIVE

## 2022-05-18 LAB — POC SARS CORONAVIRUS 2 AG -  ED: SARS Coronavirus 2 Ag: NEGATIVE

## 2022-05-18 LAB — POCT RAPID STREP A (OFFICE): Rapid Strep A Screen: POSITIVE — AB

## 2022-05-18 MED ORDER — PREDNISONE 20 MG PO TABS: ORAL_TABLET | ORAL | 0 refills | Status: DC

## 2022-05-18 MED ORDER — AMOXICILLIN 875 MG PO TABS: 875.0000 mg | ORAL_TABLET | Freq: Two times a day (BID) | ORAL | 0 refills | Status: AC

## 2022-05-18 NOTE — ED Provider Notes (Signed)
[name]    CSN: [id number] Arrival date & time: 05/18/22  1523      History   Chief Complaint Chief Complaint  Patient presents with   Nasal Congestion    Body chills, sweating, running nose, sore throat - Entered by patient    HPI [name] is a 27 y.o. female.   HPI 27 year old female presents with sore throat, cough, body aches, congestion and nasal drainage for 1 day.  PMH significant for obesity, anemia, and mild OSA.  Patient is accompanied by her younger sister who will be evaluated for similar symptoms.  Past Medical History:  Diagnosis Date   Anemia    iron   Asthma    spring   Back pain    Joint pain    Lower extremity edema    PCOS (polycystic ovarian syndrome)    Pre-diabetes    Prediabetes    Seasonal allergies    SOB (shortness of breath)     Patient Active Problem List   Diagnosis Date Noted   Status post laparoscopic sleeve gastrectomy (01/01/20) 03/09/2020   Mild obstructive sleep apnea 01/01/2020   Vitamin D deficiency 09/04/2019   PCOS (polycystic ovarian syndrome) 09/04/2019   Depression 09/04/2019   Class 3 severe obesity with serious comorbidity and body mass index (BMI) of 45.0 to 49.9 in adult 09/04/2019   Prediabetes 09/04/2019   Absolute anemia 09/04/2019    Past Surgical History:  Procedure Laterality Date   FEMUR FRACTURE SURGERY Right 2010   LAPAROSCOPIC GASTRIC SLEEVE RESECTION N/A 01/01/2020   Procedure: LAPAROSCOPIC GASTRIC SLEEVE RESECTION;  Surgeon: Greer Pickerel, MD;  Location: [facility];  Service: General;  Laterality: N/A;   UPPER GI ENDOSCOPY N/A 01/01/2020   Procedure: UPPER GI ENDOSCOPY;  Surgeon: Greer Pickerel, MD;  Location: [facility];  Service: General;  Laterality: N/A;    OB History     Gravida  0   Para  0   Term  0   Preterm  0   AB  0   Living  0      SAB  0   IAB  0   Ectopic  0   Multiple  0   Live Births               Home Medications    Prior to Admission  medications   Medication Sig Start Date End Date Taking? Authorizing Provider  albuterol (VENTOLIN HFA) 108 (90 Base) MCG/ACT inhaler Inhale 2 puffs into the lungs every 6 (six) hours as needed for wheezing or shortness of breath (Cough). 01/26/22  Yes Lynden Oxford Scales, PA-C  amoxicillin (AMOXIL) 875 MG tablet Take 1 tablet (875 mg total) by mouth 2 (two) times daily for 7 days. 05/18/22 05/25/22 Yes Eliezer Lofts, FNP  predniSONE (DELTASONE) 20 MG tablet Take 3 tabs PO daily x 5 days. 05/18/22  Yes Eliezer Lofts, FNP  albuterol (PROVENTIL) (2.5 MG/3ML) 0.083% nebulizer solution Take 3 mLs (2.5 mg total) by nebulization every 6 (six) hours as needed for wheezing or shortness of breath. 01/26/22 04/26/22  Lynden Oxford Scales, PA-C  fluticasone (FLONASE) 50 MCG/ACT nasal spray Place 1 spray into both nostrils daily. 01/26/22   Lynden Oxford Scales, PA-C  montelukast (SINGULAIR) 10 MG tablet Take 1 tablet (10 mg total) by mouth at bedtime. 01/26/22 07/25/22  Lynden Oxford Scales, PA-C    Family History Family History  Problem Relation Age of Onset   Diabetes Mother    Diabetes Father  Hypertension Father    Sleep apnea Father    Obesity Father    Cancer Other     Social History Social History   Tobacco Use   Smoking status: Never    Passive exposure: Never   Smokeless tobacco: Never  Vaping Use   Vaping Use: Never used  Substance Use Topics   Alcohol use: Yes    Comment: socially   Drug use: No     Allergies   Contrast media [iodinated contrast media] and Isovue [iopamidol]   Review of Systems Review of Systems  HENT:  Positive for congestion, rhinorrhea and sore throat.   Respiratory:  Positive for cough.   Musculoskeletal:  Positive for arthralgias and myalgias.  All other systems reviewed and are negative.    Physical Exam Triage Vital Signs ED Triage Vitals  Enc Vitals Group     BP 05/18/22 1559 110/76     Pulse Rate 05/18/22 1559 87     Resp 05/18/22  1559 18     Temp 05/18/22 1559 98.4 F (36.9 C)     Temp Source 05/18/22 1559 Oral     SpO2 05/18/22 1559 100 %     Weight 05/18/22 1601 260 lb (117.9 kg)     Height 05/18/22 1601 5\' 2"  (1.575 m)     Head Circumference --      Peak Flow --      Pain Score --      Pain Loc --      Pain Edu? --      Excl. in [place]? --    No data found.  Updated Vital Signs BP 110/76 (BP Location: Right Arm)   Pulse 87   Temp 98.4 F (36.9 C) (Oral)   Resp 18   Ht 5\' 2"  (1.575 m)   Wt 260 lb (117.9 kg)   LMP 05/13/2022   SpO2 100%   BMI 47.55 kg/m    Physical Exam Vitals and nursing note reviewed.  Constitutional:      General: She is not in acute distress.    Appearance: Normal appearance. She is obese.  HENT:     Head: Normocephalic and atraumatic.     Right Ear: Tympanic membrane, ear canal and external ear normal.     Left Ear: Tympanic membrane, ear canal and external ear normal.     Mouth/Throat:     Mouth: Mucous membranes are moist.     Pharynx: Oropharynx is clear. Uvula midline. Posterior oropharyngeal erythema and uvula swelling present.     Tonsils: Tonsillar exudate present. 4+ on the right. 4+ on the left.  Eyes:     Extraocular Movements: Extraocular movements intact.     Conjunctiva/sclera: Conjunctivae normal.     Pupils: Pupils are equal, round, and reactive to light.  Cardiovascular:     Rate and Rhythm: Normal rate and regular rhythm.     Pulses: Normal pulses.     Heart sounds: Normal heart sounds. No murmur heard. Pulmonary:     Effort: Pulmonary effort is normal.     Breath sounds: Normal breath sounds. No wheezing, rhonchi or rales.  Musculoskeletal:        General: Normal range of motion.     Cervical back: Normal range of motion and neck supple.  Skin:    General: Skin is warm and dry.  Neurological:     General: No focal deficit present.     Mental Status: She is alert and oriented to person, place, and  time. Mental status is at baseline.   Psychiatric:        Mood and Affect: Mood normal.        Behavior: Behavior normal.        Thought Content: Thought content normal.      UC Treatments / Results  Labs (all labs ordered are listed, but only abnormal results are displayed) Labs Reviewed  POCT RAPID STREP A (OFFICE) - Abnormal; Notable for the following components:      Result Value   Rapid Strep A Screen Positive (*)    All other components within normal limits  POC SARS CORONAVIRUS 2 AG -  ED  POC INFLUENZA A AND B ANTIGEN (URGENT CARE ONLY)    EKG   Radiology No results found.  Procedures Procedures (including critical care time)  Medications Ordered in UC Medications - No data to display  Initial Impression / Assessment and Plan / UC Course  I have reviewed the triage vital signs and the nursing notes.  Pertinent labs & imaging results that were available during my care of the patient were reviewed by me and considered in my medical decision making (see chart for details).    MDM: 1.  Strep pharyngitis-Rx'd amoxicillin 875 mg twice daily x 7 days; 2.  Tonsillar hypertrophy-Rx'd prednisone 60 mg daily x 5 days.  School note provided to patient per request prior to discharge. Instructed patient to take medications as directed with food to completion.  Advised patient to take prednisone with first dose of amoxicillin for the next 5 of 7 days.  Encouraged increase daily water intake to 64 ounces per day while taking these medications.  Advised if symptoms worsen and/or unresolved please follow-up with PCP or here for further evaluation.  Patient discharged home, hemodynamically stable. Final Clinical Impressions(s) / UC Diagnoses   Final diagnoses:  Strep pharyngitis  Tonsillar hypertrophy     Discharge Instructions      Instructed patient to take medications as directed with food to completion.  Advised patient to take prednisone with first dose of amoxicillin for the next 5 of 7 days.  Encouraged  increase daily water intake to 64 ounces per day while taking these medications.  Advised if symptoms worsen and/or unresolved please follow-up with PCP or here for further evaluation.     ED Prescriptions     Medication Sig Dispense Auth. Provider   amoxicillin (AMOXIL) 875 MG tablet Take 1 tablet (875 mg total) by mouth 2 (two) times daily for 7 days. 14 tablet Eliezer Lofts, FNP   predniSONE (DELTASONE) 20 MG tablet Take 3 tabs PO daily x 5 days. 15 tablet Eliezer Lofts, FNP      PDMP not reviewed this encounter.   Eliezer Lofts, [place] 05/18/22 [phone]

## 2022-05-18 NOTE — Discharge Instructions (Addendum)
Instructed patient to take medications as directed with food to completion.  Advised patient to take prednisone with first dose of amoxicillin for the next 5 of 7 days.  Encouraged increase daily water intake to 64 ounces per day while taking these medications.  Advised if symptoms worsen and/or unresolved please follow-up with PCP or here for further evaluation.

## 2022-05-18 NOTE — ED Triage Notes (Signed)
Patient c/o sore throat, cough, body aches, congestion and nasal drainage x 1 day.  Patient has taken Aleve.

## 2022-05-29 ENCOUNTER — Ambulatory Visit
Admission: RE | Admit: 2022-05-29 | Discharge: 2022-05-29 | Disposition: A | Discharge status: Home / Self Care | Payer: BC Managed Care – PPO | Source: Ambulatory Visit | Attending: Family Medicine | Admitting: Family Medicine

## 2022-05-29 VITALS — BP 114/79 | HR 94 | Temp 98.8°F | Resp 16 | Ht 62.0 in | Wt 254.0 lb

## 2022-05-29 DIAGNOSIS — J029 Acute pharyngitis, unspecified: Secondary | ICD-10-CM

## 2022-05-29 DIAGNOSIS — Z20818 Contact with and (suspected) exposure to other bacterial communicable diseases: Secondary | ICD-10-CM

## 2022-05-29 LAB — POCT RAPID STREP A (OFFICE): Rapid Strep A Screen: NEGATIVE

## 2022-05-29 MED ORDER — PENICILLIN V POTASSIUM 500 MG PO TABS: 500.0000 mg | ORAL_TABLET | Freq: Two times a day (BID) | ORAL | 0 refills | Status: AC

## 2022-05-29 NOTE — Discharge Instructions (Addendum)
Take the penicillin 2 times a day Drink lots of water May use sore throat lozenges or spray for throat pain Tylenol or ibuprofen for pain Your culture swab has been sent to the laboratory.

## 2022-05-29 NOTE — ED Triage Notes (Signed)
Pt c/o  sore throat since yesterday  Hurts to swallow  Dry throat  Takes zyrtec  Completed antibiotics for strep

## 2022-05-29 NOTE — ED Provider Notes (Signed)
[facility]    CSN: [id number] Arrival date & time: 05/29/22  1420      History   Chief Complaint Chief Complaint  Patient presents with   Sore Throat    HPI [name] is a 27 y.o. female.   HPI  Patient is a Runner, broadcasting/film/video.  She had strep throat on 05/18/2022.  She was given 7 days of amoxicillin.  She states she felt better on the antibiotic.  Within 48 hours of stopping the antibiotic, however, her sore throat is returning.  Today it is painful.  Not quite as bad as last time.  No fever.  She is worried about recurrent or incompletely treated's strep throat  Past Medical History:  Diagnosis Date   Anemia    iron   Asthma    spring   Back pain    Joint pain    Lower extremity edema    PCOS (polycystic ovarian syndrome)    Pre-diabetes    Prediabetes    Seasonal allergies    SOB (shortness of breath)     Patient Active Problem List   Diagnosis Date Noted   Status post laparoscopic sleeve gastrectomy (01/01/20) 03/09/2020   Mild obstructive sleep apnea 01/01/2020   Vitamin D deficiency 09/04/2019   PCOS (polycystic ovarian syndrome) 09/04/2019   Depression 09/04/2019   Class 3 severe obesity with serious comorbidity and body mass index (BMI) of 45.0 to 49.9 in adult 09/04/2019   Prediabetes 09/04/2019   Absolute anemia 09/04/2019    Past Surgical History:  Procedure Laterality Date   FEMUR FRACTURE SURGERY Right 2010   LAPAROSCOPIC GASTRIC SLEEVE RESECTION N/A 01/01/2020   Procedure: LAPAROSCOPIC GASTRIC SLEEVE RESECTION;  Surgeon: Gaynelle Adu, MD;  Location: [facility];  Service: General;  Laterality: N/A;   UPPER GI ENDOSCOPY N/A 01/01/2020   Procedure: UPPER GI ENDOSCOPY;  Surgeon: Gaynelle Adu, MD;  Location: [facility];  Service: General;  Laterality: N/A;    OB History     Gravida  0   Para  0   Term  0   Preterm  0   AB  0   Living  0      SAB  0   IAB  0   Ectopic  0   Multiple  0   Live Births               Home  Medications    Prior to Admission medications   Medication Sig Start Date End Date Taking? Authorizing Provider  penicillin v potassium (VEETID) 500 MG tablet Take 1 tablet (500 mg total) by mouth 2 (two) times daily for 10 days. 05/29/22 06/08/22 Yes Eustace Moore, MD  albuterol (PROVENTIL) (2.5 MG/3ML) 0.083% nebulizer solution Take 3 mLs (2.5 mg total) by nebulization every 6 (six) hours as needed for wheezing or shortness of breath. 01/26/22 04/26/22  Theadora Rama Scales, PA-C  albuterol (VENTOLIN HFA) 108 (90 Base) MCG/ACT inhaler Inhale 2 puffs into the lungs every 6 (six) hours as needed for wheezing or shortness of breath (Cough). Patient not taking: Reported on 05/29/2022 01/26/22   Theadora Rama Scales, PA-C    Family History Family History  Problem Relation Age of Onset   Diabetes Mother    Diabetes Father    Hypertension Father    Sleep apnea Father    Obesity Father    Cancer Other     Social History Social History   Tobacco Use   Smoking status: Never  Passive exposure: Never   Smokeless tobacco: Never  Vaping Use   Vaping Use: Never used  Substance Use Topics   Alcohol use: Yes    Comment: socially   Drug use: No     Allergies   Contrast media [iodinated contrast media] and Isovue [iopamidol]   Review of Systems Review of Systems  See HPI Physical Exam Triage Vital Signs ED Triage Vitals  Enc Vitals Group     BP 05/29/22 1428 114/79     Pulse Rate 05/29/22 1428 94     Resp 05/29/22 1428 16     Temp 05/29/22 1428 98.8 F (37.1 C)     Temp Source 05/29/22 1428 Oral     SpO2 05/29/22 1428 100 %     Weight 05/29/22 1430 254 lb (115.2 kg)     Height 05/29/22 1430  (1.575 m)     Head Circumference --      Peak Flow --      Pain Score 05/29/22 1430 4     Pain Loc --      Pain Edu? --      Excl. in [place]? --    No data found.  Updated Vital Signs BP 114/79 (BP Location: Right Arm)   Pulse 94   Temp 98.8 F (37.1 C) (Oral)   Resp  16   Ht  (1.575 m)   Wt 115.2 kg   LMP 05/13/2022 (Exact Date)   SpO2 100%   BMI 46.46 kg/m      Physical Exam Constitutional:      General: She is not in acute distress.    Appearance: She is well-developed. She is obese. She is not ill-appearing.  HENT:     Head: Normocephalic and atraumatic.     Right Ear: Tympanic membrane and ear canal normal.     Left Ear: Tympanic membrane and ear canal normal.     Nose: Congestion present.     Mouth/Throat:     Pharynx: Uvula midline. Pharyngeal swelling and posterior oropharyngeal erythema present.     Tonsils: No tonsillar exudate. 2+ on the right. 2+ on the left.  Eyes:     Conjunctiva/sclera: Conjunctivae normal.     Pupils: Pupils are equal, round, and reactive to light.  Cardiovascular:     Rate and Rhythm: Normal rate.  Pulmonary:     Effort: Pulmonary effort is normal. No respiratory distress.  Abdominal:     General: There is no distension.     Palpations: Abdomen is soft.  Musculoskeletal:        General: Normal range of motion.     Cervical back: Normal range of motion.  Skin:    General: Skin is warm and dry.  Neurological:     Mental Status: She is alert.      UC Treatments / Results  Labs (all labs ordered are listed, but only abnormal results are displayed) Labs Reviewed  CULTURE, GROUP A STREP [facility])  POCT RAPID STREP A (OFFICE)    EKG   Radiology No results found.  Procedures Procedures (including critical care time)  Medications Ordered in UC Medications - No data to display  Initial Impression / Assessment and Plan / UC Course  I have reviewed the triage vital signs and the nursing notes.  Pertinent labs & imaging results that were available during my care of the patient were reviewed by me and considered in my medical decision making (see chart for details).  The rapid strep test is negative.  I am going to cover her with antibiotics until her culture comes back. Final Clinical  Impressions(s) / UC Diagnoses   Final diagnoses:  Sore throat  Strep throat exposure     Discharge Instructions      Take the penicillin 2 times a day Drink lots of water May use sore throat lozenges or spray for throat pain Tylenol or ibuprofen for pain Your culture swab has been sent to the laboratory.     ED Prescriptions     Medication Sig Dispense Auth. Provider   penicillin v potassium (VEETID) 500 MG tablet Take 1 tablet (500 mg total) by mouth 2 (two) times daily for 10 days. 20 tablet Eustace Moore, MD      PDMP not reviewed this encounter.   Eustace Moore, MD 05/29/22 [phone]

## 2022-06-01 LAB — CULTURE, GROUP A STREP (THRC)

## 2022-06-09 ENCOUNTER — Ambulatory Visit (INDEPENDENT_AMBULATORY_CARE_PROVIDER_SITE_OTHER): Payer: BC Managed Care – PPO | Admitting: Family Medicine

## 2022-06-09 ENCOUNTER — Encounter: Payer: Self-pay | Admitting: Family Medicine

## 2022-06-09 VITALS — BP 115/76 | HR 71 | Temp 98.2°F | Resp 18 | Ht 62.0 in | Wt 251.5 lb

## 2022-06-09 DIAGNOSIS — R109 Unspecified abdominal pain: Secondary | ICD-10-CM

## 2022-06-09 DIAGNOSIS — Z1159 Encounter for screening for other viral diseases: Secondary | ICD-10-CM

## 2022-06-09 DIAGNOSIS — Z124 Encounter for screening for malignant neoplasm of cervix: Secondary | ICD-10-CM

## 2022-06-09 DIAGNOSIS — Z13220 Encounter for screening for lipoid disorders: Secondary | ICD-10-CM

## 2022-06-09 DIAGNOSIS — Z0000 Encounter for general adult medical examination without abnormal findings: Secondary | ICD-10-CM | POA: Diagnosis not present

## 2022-06-09 DIAGNOSIS — R7302 Impaired glucose tolerance (oral): Secondary | ICD-10-CM

## 2022-06-09 NOTE — Progress Notes (Signed)
Complete physical exam  Patient: [name]   DOB: 19-Feb-1995   26 y.o. Female  MRN: [id number]  Subjective:    Chief Complaint  Patient presents with   Annual Exam    [name] is a 27 y.o. female who presents today for a complete physical exam. She reports consuming a  low carbohydrate  diet. Home exercise routine includes gym and walking. She generally feels well. She reports sleeping well. She does have additional problems to discuss today.  Pt having left flank pain for the last week. No dysuria or hematuria. 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  Most recent fall risk assessment:    04/28/2022    4:01 PM  Fall Risk   Falls in the past year? 0  Number falls in past yr: 0  Injury with Fall? 0  Risk for fall due to : No Fall Risks  Follow up Falls evaluation completed     Most recent depression screenings:    04/28/2022    4:01 PM 10/24/2019    7:44 AM  PHQ 2/9 Scores  PHQ - 2 Score 0 0  PHQ- 9 Score 2     Vision:Within last year and Dental: No current dental problems  Patient Active Problem List   Diagnosis Date Noted   Status post laparoscopic sleeve gastrectomy (01/01/20) 03/09/2020   Mild obstructive sleep apnea 01/01/2020   Vitamin D deficiency 09/04/2019   PCOS (polycystic ovarian syndrome) 09/04/2019   Depression 09/04/2019   Class 3 severe obesity with serious comorbidity and body mass index (BMI) of 45.0 to 49.9 in adult 09/04/2019   Prediabetes 09/04/2019   Absolute anemia 09/04/2019   Past Medical History:  Diagnosis Date   Anemia    iron   Asthma    spring   Back pain    Joint pain    Lower extremity edema    PCOS (polycystic ovarian syndrome)    Pre-diabetes    Prediabetes    Seasonal allergies    SOB (shortness of breath)    Past Surgical History:  Procedure Laterality Date   FEMUR FRACTURE SURGERY Right 2010   LAPAROSCOPIC GASTRIC SLEEVE RESECTION N/A 01/01/2020   Procedure: LAPAROSCOPIC GASTRIC SLEEVE  RESECTION;  Surgeon: Gaynelle Adu, MD;  Location: [facility];  Service: General;  Laterality: N/A;   UPPER GI ENDOSCOPY N/A 01/01/2020   Procedure: UPPER GI ENDOSCOPY;  Surgeon: Gaynelle Adu, MD;  Location: [facility];  Service: General;  Laterality: N/A;   Social History   Socioeconomic History   Marital status: Single    Spouse name: Not on file   Number of children: Not on file   Years of education: Not on file   Highest education level: Not on file  Occupational History   Occupation: Geologist, engineering   Occupation: full time student  Tobacco Use   Smoking status: Never    Passive exposure: Never   Smokeless tobacco: Never  Vaping Use   Vaping Use: Never used  Substance and Sexual Activity   Alcohol use: Yes    Comment: socially   Drug use: No   Sexual activity: Yes    Birth control/protection: None  Other Topics Concern   Not on file  Social History Narrative   Not on file   Social Determinants of Health   Financial Resource Strain: Not on file  Food Insecurity: Not on file  Transportation Needs: Not on file  Physical Activity: Not on file  Stress: Not on file  Social Connections: Not on file  Intimate Partner Violence: Not on file   Family Status  Relation Name Status   Mother  Alive   Father  Alive   Other  (Not Specified)      Patient Care Team: Suzan Slick, MD as PCP - General (Family Medicine)   Outpatient Medications Prior to Visit  Medication Sig   albuterol (PROVENTIL) (2.5 MG/3ML) 0.083% nebulizer solution Take 3 mLs (2.5 mg total) by nebulization every 6 (six) hours as needed for wheezing or shortness of breath.   [DISCONTINUED] albuterol (VENTOLIN HFA) 108 (90 Base) MCG/ACT inhaler Inhale 2 puffs into the lungs every 6 (six) hours as needed for wheezing or shortness of breath (Cough). (Patient not taking: Reported on 05/29/2022)   No facility-administered medications prior to visit.    Review of Systems  Gastrointestinal:        Left flank  pain  All other systems reviewed and are negative.        Objective:     BP 115/76   Pulse 71   Temp 98.2 F (36.8 C) (Oral)   Resp 18   Ht 5\' 2"  (1.575 m)   Wt 251 lb 8 oz (114.1 kg)   LMP 05/13/2022   SpO2 100%   BMI 46.00 kg/m  BP Readings from Last 3 Encounters:  06/09/22 115/76  05/29/22 114/79  05/18/22 110/76      Physical Exam Vitals and nursing note reviewed. Exam conducted with a chaperone present.  Constitutional:      Appearance: She is obese.  HENT:     Head: Normocephalic and atraumatic.     Right Ear: Tympanic membrane, ear canal and external ear normal.     Left Ear: Tympanic membrane, ear canal and external ear normal.     Nose: Nose normal.     Mouth/Throat:     Mouth: Mucous membranes are moist.  Eyes:     Conjunctiva/sclera: Conjunctivae normal.     Pupils: Pupils are equal, round, and reactive to light.  Cardiovascular:     Rate and Rhythm: Normal rate and regular rhythm.     Pulses: Normal pulses.     Heart sounds: Normal heart sounds.  Abdominal:     General: Abdomen is flat. Bowel sounds are normal.  Genitourinary:    General: Normal vulva.  Musculoskeletal:        General: Normal range of motion.  Skin:    General: Skin is warm.     Capillary Refill: Capillary refill takes less than 2 seconds.  Neurological:     General: No focal deficit present.     Mental Status: She is alert and oriented to person, place, and time. Mental status is at baseline.  Psychiatric:        Mood and Affect: Mood normal.        Behavior: Behavior normal.        Thought Content: Thought content normal.        Judgment: Judgment normal.     No results found for any visits on 06/09/22.      Assessment & Plan:    Routine Health Maintenance and Physical Exam  Immunization History  Administered Date(s) Administered   PFIZER(Purple Top)SARS-COV-2 Vaccination 04/14/2019, 05/05/2019   PPD Test 02/18/2018    Health Maintenance  Topic Date Due    HPV VACCINES (1 - 2-dose series) Never done   Hepatitis C Screening  Never done   DTaP/Tdap/Td (1 -  Tdap) Never done   PAP-Cervical Cytology Screening  Never done   PAP SMEAR-Modifier  Never done   COVID-19 Vaccine (3 - 2023-24 season) 10/16/2021   INFLUENZA VACCINE  09/16/2022   HIV Screening  Completed    Discussed health benefits of physical activity, and encouraged her to engage in regular exercise appropriate for her age and condition.  Problem List Items Addressed This Visit   None Visit Diagnoses     Annual physical exam    -  Primary   Impaired glucose tolerance       Encounter for lipid screening for cardiovascular disease       Need for hepatitis C screening test       Screening for cervical cancer          No follow-ups on file. Annual physical exam  Impaired glucose tolerance -     CBC with Differential/Platelet -     Comprehensive metabolic panel -     Hemoglobin A1c  Encounter for lipid screening for cardiovascular disease -     Lipid panel  Need for hepatitis C screening test -     Hepatitis C antibody  Screening for cervical cancer -     IGP, CtNg, rfx Aptima HPV ASCU  Left flank pain -     Urinalysis   Screening labs and pap smear. Due to left flank pain, will screen for UTI and also did pap with screening viral illnesses today. Follow up on results. See in 1 year sooner prn     Suzan Slick, MD

## 2022-06-10 LAB — CBC WITH DIFFERENTIAL/PLATELET
Basophils Absolute: 0.1 10*3/uL (ref 0.0–0.2)
Basos: 1 %
EOS (ABSOLUTE): 0.2 10*3/uL (ref 0.0–0.4)
Eos: 5 %
Hematocrit: 33.4 % — ABNORMAL LOW (ref 34.0–46.6)
Hemoglobin: 10 g/dL — ABNORMAL LOW (ref 11.1–15.9)
Immature Grans (Abs): 0 10*3/uL (ref 0.0–0.1)
Immature Granulocytes: 0 %
Lymphocytes Absolute: 2.6 10*3/uL (ref 0.7–3.1)
Lymphs: 50 %
MCH: 21.6 pg — ABNORMAL LOW (ref 26.6–33.0)
MCHC: 29.9 g/dL — ABNORMAL LOW (ref 31.5–35.7)
MCV: 72 fL — ABNORMAL LOW (ref 79–97)
Monocytes Absolute: 0.6 10*3/uL (ref 0.1–0.9)
Monocytes: 11 %
Neutrophils Absolute: 1.7 10*3/uL (ref 1.4–7.0)
Neutrophils: 33 %
Platelets: 431 10*3/uL (ref 150–450)
RBC: 4.62 x10E6/uL (ref 3.77–5.28)
RDW: 16.1 % — ABNORMAL HIGH (ref 11.7–15.4)
WBC: 5.2 10*3/uL (ref 3.4–10.8)

## 2022-06-10 LAB — LIPID PANEL
Chol/HDL Ratio: 3 ratio (ref 0.0–4.4)
Cholesterol, Total: 131 mg/dL (ref 100–199)
HDL: 44 mg/dL (ref >39)
LDL Chol Calc (NIH): 76 mg/dL (ref 0–99)
Triglycerides: 51 mg/dL (ref 0–149)
VLDL Cholesterol Cal: 11 mg/dL (ref 5–40)

## 2022-06-10 LAB — HEMOGLOBIN A1C
Est. average glucose Bld gHb Est-mCnc: 123 mg/dL
Hgb A1c MFr Bld: 5.9 % — ABNORMAL HIGH (ref 4.8–5.6)

## 2022-06-10 LAB — COMPREHENSIVE METABOLIC PANEL
ALT: 10 IU/L (ref 0–32)
AST: 14 IU/L (ref 0–40)
Albumin/Globulin Ratio: 1.4 (ref 1.2–2.2)
Albumin: 4 g/dL (ref 4.0–5.0)
Alkaline Phosphatase: 65 IU/L (ref 44–121)
BUN/Creatinine Ratio: 17 (ref 9–23)
BUN: 12 mg/dL (ref 6–20)
Bilirubin Total: 0.3 mg/dL (ref 0.0–1.2)
CO2: 21 mmol/L (ref 20–29)
Calcium: 9.1 mg/dL (ref 8.7–10.2)
Chloride: 104 mmol/L (ref 96–106)
Creatinine, Ser: 0.69 mg/dL (ref 0.57–1.00)
Globulin, Total: 2.8 g/dL (ref 1.5–4.5)
Glucose: 79 mg/dL (ref 70–99)
Potassium: 4.3 mmol/L (ref 3.5–5.2)
Sodium: 139 mmol/L (ref 134–144)
Total Protein: 6.8 g/dL (ref 6.0–8.5)
eGFR: 123 mL/min/{1.73_m2} (ref >59)

## 2022-06-10 LAB — URINALYSIS
Bilirubin, UA: NEGATIVE
Glucose, UA: NEGATIVE
Ketones, UA: NEGATIVE
Leukocytes,UA: NEGATIVE
Nitrite, UA: NEGATIVE
Protein,UA: NEGATIVE
RBC, UA: NEGATIVE
Specific Gravity, UA: 1.022 (ref 1.005–1.030)
Urobilinogen, Ur: 1 mg/dL (ref 0.2–1.0)
pH, UA: 7 (ref 5.0–7.5)

## 2022-06-10 LAB — HEPATITIS C ANTIBODY: Hep C Virus Ab: NONREACTIVE

## 2022-06-11 LAB — IGP, CTNG, RFX APTIMA HPV ASCU
Chlamydia, Nuc. Acid Amp: NEGATIVE
Gonococcus by Nucleic Acid Amp: NEGATIVE
PAP Smear Comment: 0

## 2022-06-30 ENCOUNTER — Ambulatory Visit (INDEPENDENT_AMBULATORY_CARE_PROVIDER_SITE_OTHER): Payer: BC Managed Care – PPO | Admitting: Bariatrics

## 2022-06-30 ENCOUNTER — Encounter: Payer: Self-pay | Admitting: Bariatrics

## 2022-06-30 VITALS — BP 118/72 | HR 88 | Temp 99.2°F | Ht 62.0 in | Wt 250.0 lb

## 2022-06-30 DIAGNOSIS — D500 Iron deficiency anemia secondary to blood loss (chronic): Secondary | ICD-10-CM

## 2022-06-30 DIAGNOSIS — R7303 Prediabetes: Secondary | ICD-10-CM | POA: Diagnosis not present

## 2022-06-30 DIAGNOSIS — E669 Obesity, unspecified: Secondary | ICD-10-CM | POA: Diagnosis not present

## 2022-06-30 DIAGNOSIS — Z6841 Body Mass Index (BMI) 40.0 and over, adult: Secondary | ICD-10-CM

## 2022-06-30 DIAGNOSIS — Z9884 Bariatric surgery status: Secondary | ICD-10-CM

## 2022-06-30 DIAGNOSIS — Z0289 Encounter for other administrative examinations: Secondary | ICD-10-CM

## 2022-06-30 NOTE — Progress Notes (Signed)
Office: [phone]  /  Fax: [phone]   Initial Visit  [name] was seen in clinic today to evaluate for obesity. She is interested in losing weight to improve overall health and reduce the risk of weight related complications. She presents today to review program treatment options, initial physical assessment, and evaluation.     She was referred by: Self-Referral  When asked what else they would like to accomplish? She states: Adopt healthier eating patterns, Improve energy levels and physical activity, Improve quality of life, and Improve appearance  When asked how has your weight affected you? She states: Having fatigue  Some associated conditions: Prediabetes and Other: anemia ( iron deficiency)   Contributing factors: Stress and Reduced physical activity  Weight promoting medications identified: None  Current nutrition plan: Low-carb  Current level of physical activity: None, Walking, and Other: U-tube activities   Current or previous pharmacotherapy: Metformin  Response to medication: Lost weight and was able to maintain weight loss   Past medical history includes:   Past Medical History:  Diagnosis Date   Anemia    iron   Asthma    spring   Back pain    Joint pain    Lower extremity edema    PCOS (polycystic ovarian syndrome)    Pre-diabetes    Prediabetes    Seasonal allergies    SOB (shortness of breath)      Objective:   BP 118/72   Pulse 88   Temp 99.2 F (37.3 C)   Ht 5\' 2"  (1.575 m)   Wt 250 lb (113.4 kg)   LMP 05/13/2022   SpO2 99%   BMI 45.73 kg/m  She was weighed on the bioimpedance scale: Body mass index is 45.73 kg/m.  Peak Weight:330 lbs  , Body Fat%:48.1 %, Visceral Fat Rating:13, Weight trend over the last 12 months: Unchanged  General:  Alert, oriented and cooperative. Patient is in no acute distress.  Respiratory: Normal respiratory effort, no problems with respiration noted  Extremities: Normal range of motion.     Mental Status: Normal mood and affect. Normal behavior. Normal judgment and thought content.   DIAGNOSTIC DATA REVIEWED:  BMET    Component Value Date/Time   NA 139 06/09/2022 0916   K 4.3 06/09/2022 0916   CL 104 06/09/2022 0916   CO2 21 06/09/2022 0916   GLUCOSE 79 06/09/2022 0916   GLUCOSE 89 03/15/2021 1344   BUN 12 06/09/2022 0916   CREATININE 0.69 06/09/2022 0916   CALCIUM 9.1 06/09/2022 0916   GFRNONAA >60 03/15/2021 1344   GFRAA 146 08/07/2019 1222   Lab Results  Component Value Date   HGBA1C 5.9 (H) 06/09/2022   HGBA1C 5.8 (H) 12/21/2019   No results found for: "INSULIN" CBC    Component Value Date/Time   WBC 5.2 06/09/2022 0916   WBC 6.0 03/15/2021 1344   RBC 4.62 06/09/2022 0916   RBC 4.31 03/15/2021 1344   HGB 10.0 (L) 06/09/2022 0916   HCT 33.4 (L) 06/09/2022 0916   PLT 431 06/09/2022 0916   MCV 72 (L) 06/09/2022 0916   MCH 21.6 (L) 06/09/2022 0916   MCH 24.6 (L) 03/15/2021 1344   MCHC 29.9 (L) 06/09/2022 0916   MCHC 31.5 03/15/2021 1344   RDW 16.1 (H) 06/09/2022 0916   Iron/TIBC/Ferritin/ %Sat    Component Value Date/Time   IRON 48 08/07/2019 1222   TIBC 359 08/07/2019 1222   FERRITIN 29 08/07/2019 1222   IRONPCTSAT 13 (L) 08/07/2019 1222  Lipid Panel     Component Value Date/Time   CHOL 131 06/09/2022 0916   TRIG 51 06/09/2022 0916   HDL 44 06/09/2022 0916   CHOLHDL 3.0 06/09/2022 0916   LDLCALC 76 06/09/2022 0916   Hepatic Function Panel     Component Value Date/Time   PROT 6.8 06/09/2022 0916   ALBUMIN 4.0 06/09/2022 0916   AST 14 06/09/2022 0916   ALT 10 06/09/2022 0916   ALKPHOS 65 06/09/2022 0916   BILITOT 0.3 06/09/2022 0916      Component Value Date/Time   TSH 1.910 08/07/2019 1222     Assessment and Plan:   Other iron deficiency anemia:  She has a history of a gastric sleeve and has subsequently had iron infusions and has taken iron. Her last Hgb and Hct were somewhat low.  Plan: She will begin iron per her PCP.    Prediabetes  HgBA1c was 5.9  Plan: Will minimize her carbohydrates ( sweets and starches ).   History of gastric sleeve gastrectomy   Her highest weight was 330 lbs and her lowest weight was 230 lbs.   Plan: Will eat small, frequent, meals.      Obesity Treatment / Action Plan:  Patient will work on garnering support from family and friends to begin weight loss journey. Will work on eliminating or reducing the presence of highly palatable, calorie dense foods in the home. Will complete provided nutritional and psychosocial assessment questionnaire before the next appointment. Will be scheduled for indirect calorimetry to determine resting energy expenditure in a fasting state.  This will allow [place] to create a reduced calorie, high-protein meal plan to promote loss of fat mass while preserving muscle mass. Will reduce the frequency of eating out and making healthier choices by advanced menu planning. Counseled on the health benefits of losing 5%-15% of total body weight. Was counseled on nutritional approaches to weight loss and benefits of reducing processed foods and consuming plant-based foods and high quality protein as part of nutritional weight management. Was counseled on pharmacotherapy and role as an adjunct in weight management.   Obesity Education Performed Today:  She was weighed on the bioimpedance scale and results were discussed and documented in the synopsis.  We discussed obesity as a disease and the importance of a more detailed evaluation of all the factors contributing to the disease.  We discussed the importance of long term lifestyle changes which include nutrition, exercise and behavioral modifications as well as the importance of customizing this to her specific health and social needs.  We discussed the benefits of reaching a healthier weight to alleviate the symptoms of existing conditions and reduce the risks of the biomechanical, metabolic and  psychological effects of obesity.  Discussed New Patient/Late Arrival, and Cancellation Policies. Patient voiced understanding and allowed to ask questions.   [name] appears to be in the action stage of change and states they are ready to start intensive lifestyle modifications and behavioral modifications.  30 minutes was spent today on this visit including the above counseling, pre-visit chart review, and post-visit documentation.  Reviewed by clinician on day of visit: allergies, medications, problem list, medical history, surgical history, family history, social history, and previous encounter notes.    Cathy Ropp A. Lorretta HarpO.

## 2022-07-14 IMAGING — CR DG ANKLE COMPLETE 3+V*R*
3 series · 3 of 3 positions shown · non-contrast
Comparison: 01/23/2013

CLINICAL DATA: Twisting injury.  Pain and swelling medially.

EXAM:
RIGHT ANKLE - COMPLETE 3+ VIEW

[ankle ap]
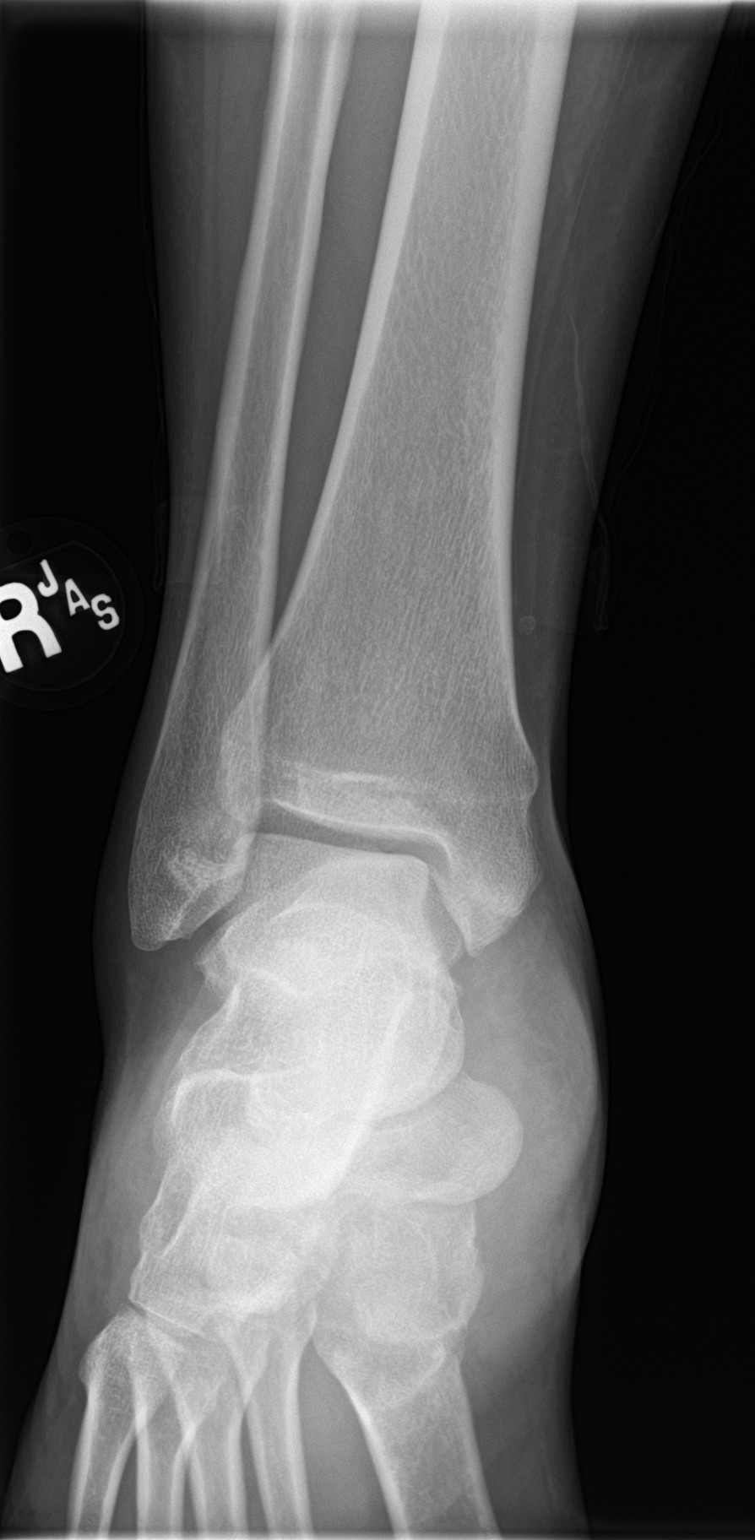

[ankle obl]
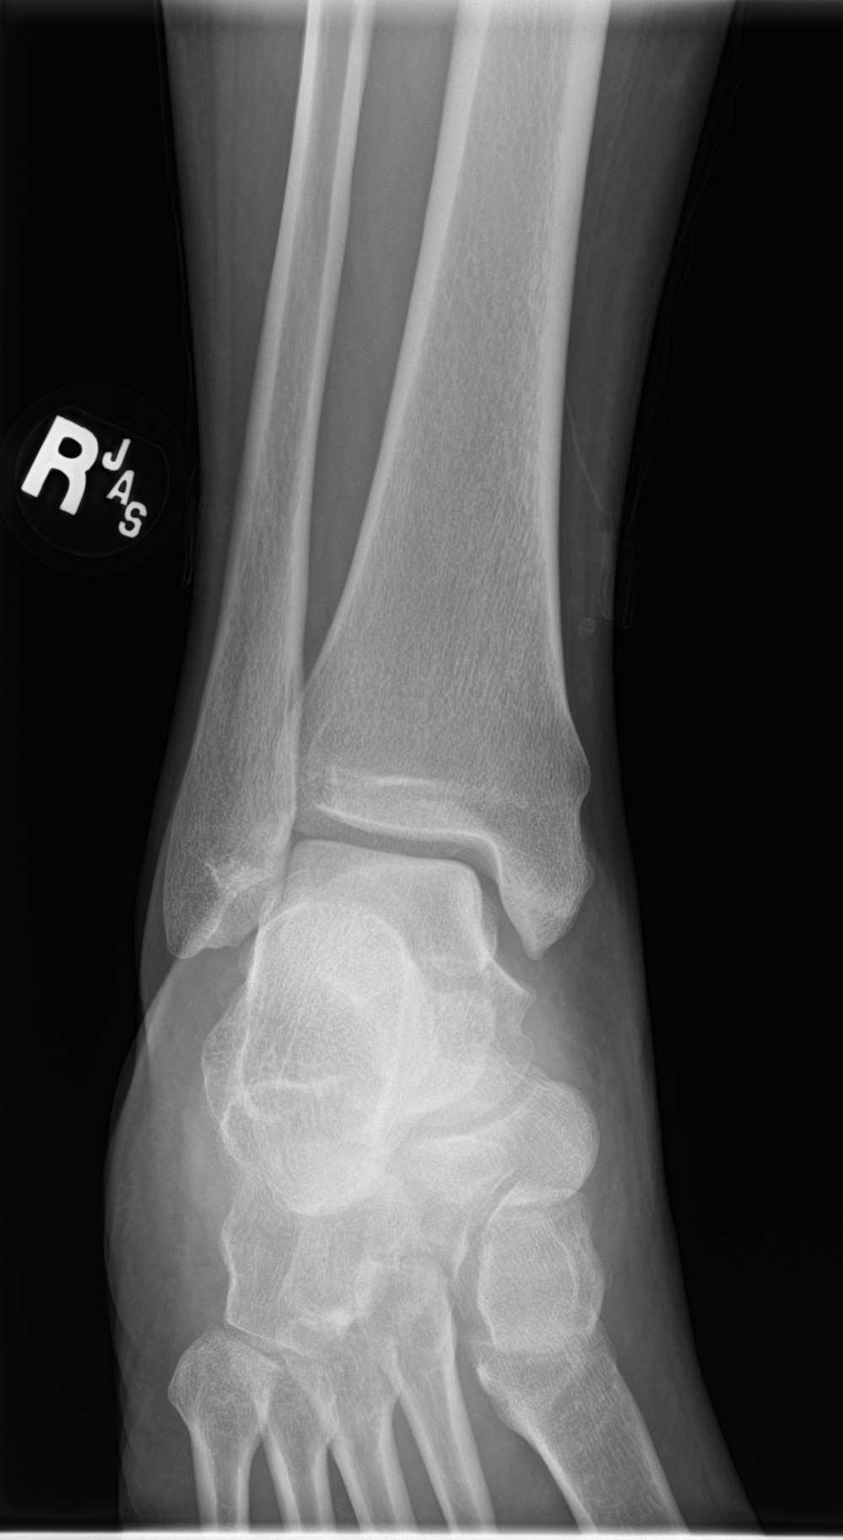

[ankle lat]
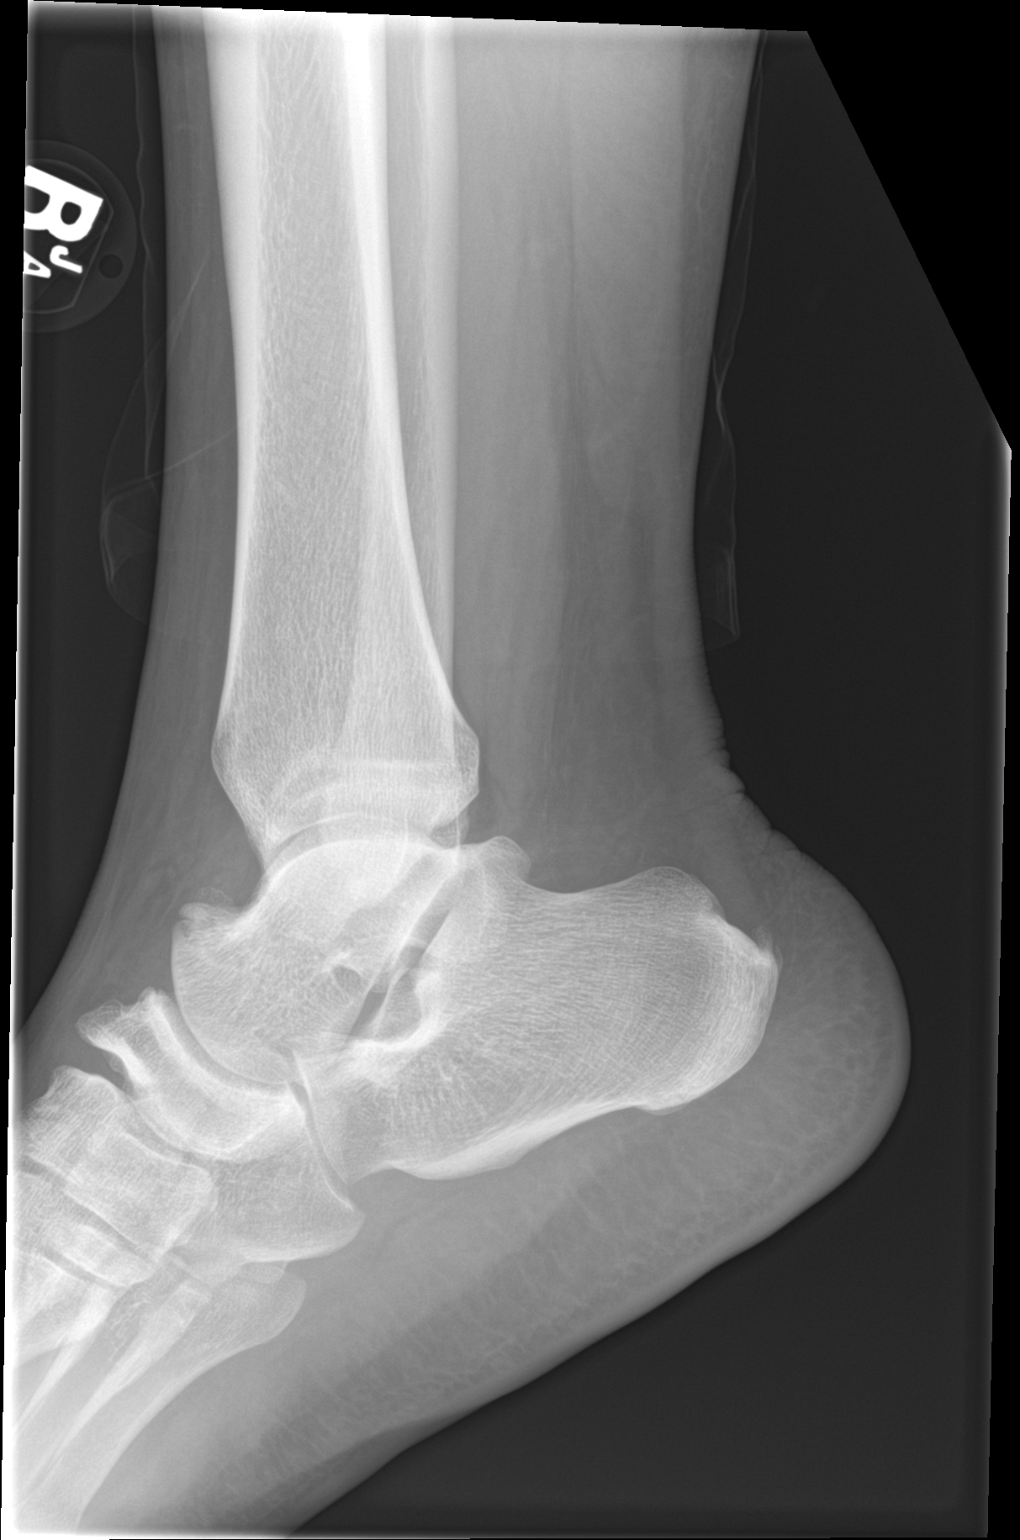

[3 of 3 positions shown; findings below may reference images not displayed]

FINDINGS: No acute fracture or dislocation. Base of fifth metatarsal and talar
dome intact. Tiny Achilles spur.
IMPRESSION: No acute osseous abnormality.

## 2022-07-22 ENCOUNTER — Ambulatory Visit (INDEPENDENT_AMBULATORY_CARE_PROVIDER_SITE_OTHER): Payer: BC Managed Care – PPO | Admitting: Bariatrics

## 2022-07-22 ENCOUNTER — Encounter: Payer: Self-pay | Admitting: Bariatrics

## 2022-07-22 VITALS — BP 120/85 | HR 85 | Temp 98.3°F | Ht 62.0 in | Wt 249.0 lb

## 2022-07-22 DIAGNOSIS — D500 Iron deficiency anemia secondary to blood loss (chronic): Secondary | ICD-10-CM

## 2022-07-22 DIAGNOSIS — R0602 Shortness of breath: Secondary | ICD-10-CM

## 2022-07-22 DIAGNOSIS — Z6841 Body Mass Index (BMI) 40.0 and over, adult: Secondary | ICD-10-CM

## 2022-07-22 DIAGNOSIS — E559 Vitamin D deficiency, unspecified: Secondary | ICD-10-CM | POA: Diagnosis not present

## 2022-07-22 DIAGNOSIS — R5383 Other fatigue: Secondary | ICD-10-CM

## 2022-07-22 DIAGNOSIS — E538 Deficiency of other specified B group vitamins: Secondary | ICD-10-CM

## 2022-07-22 DIAGNOSIS — Z9884 Bariatric surgery status: Secondary | ICD-10-CM

## 2022-07-22 DIAGNOSIS — Z1331 Encounter for screening for depression: Secondary | ICD-10-CM | POA: Diagnosis not present

## 2022-07-22 DIAGNOSIS — R7303 Prediabetes: Secondary | ICD-10-CM

## 2022-07-22 MED ORDER — METFORMIN HCL 500 MG PO TABS
500.0000 mg | ORAL_TABLET | Freq: Every day | ORAL | 0 refills | Status: DC
Start: 2022-07-22 — End: 2022-08-09

## 2022-07-22 MED ORDER — POLYSACCHARIDE IRON COMPLEX 150 MG PO CAPS
150.0000 mg | ORAL_CAPSULE | Freq: Every day | ORAL | 0 refills | Status: DC
Start: 2022-07-22 — End: 2022-08-16

## 2022-07-23 LAB — VITAMIN B12: Vitamin B-12: 725 pg/mL (ref 232–1245)

## 2022-07-23 LAB — VITAMIN D 25 HYDROXY (VIT D DEFICIENCY, FRACTURES): Vit D, 25-Hydroxy: 11.3 ng/mL — ABNORMAL LOW (ref 30.0–100.0)

## 2022-07-23 LAB — INSULIN, RANDOM: INSULIN: 10 u[IU]/mL (ref 2.6–24.9)

## 2022-07-26 NOTE — Progress Notes (Unsigned)
Chief Complaint:   OBESITY [name] (MR# [id number]) is a 27 y.o. female who presents for evaluation and treatment of obesity and related comorbidities. Current BMI is Body mass index is 45.54 kg/m. [name] has been struggling with her weight for many years and has been unsuccessful in either losing weight, maintaining weight loss, or reaching her healthy weight goal.  [name] is currently in the action stage of change and ready to dedicate time achieving and maintaining a healthier weight. [name] is interested in becoming our patient and working on intensive lifestyle modifications including (but not limited to) diet and exercise for weight loss.  Patient is here for her initial visit, but she has and information session with me.   [name]'s habits were reviewed today and are as follows: {MWM WT HABITS:23461}.  Depression Screen [name]'s Food and Mood (modified PHQ-9) score was ***.  Subjective:   1. Other fatigue [name] {Actions; denies/reports/admits to:19208} daytime somnolence and {Actions; denies/reports/admits to:19208} waking up still tired. Patient has a history of symptoms of {OSA Sx:17850}. Freedom generally gets {numbers (fuzzy):14653} hours of sleep per night, and states that she has {sleep quality:17851}. Snoring {is/are not:32546} present. Apneic episodes {is/are not:32546} present. Epworth Sleepiness Score is ***.   2. SOB (shortness of breath) Aspasia notes increasing shortness of breath with exercising and seems to be worsening over time with weight gain. She notes getting out of breath sooner with activity than she used to. This has not gotten worse recently. Kalasia denies shortness of breath at rest or orthopnea.  3. Prediabetes ***  4. Status post laparoscopic sleeve gastrectomy (01/01/20) ***  5. Iron deficiency anemia due to chronic blood loss ***  6. Vitamin D deficiency ***  7. B12 deficiency ***  Assessment/Plan:   1. Other fatigue [name]  does feel that her weight is causing her energy to be lower than it should be. Fatigue may be related to obesity, depression or many other causes. Labs will be ordered, and in the meanwhile, [name] will focus on self care including making healthy food choices, increasing physical activity and focusing on stress reduction.  - EKG 12-Lead  2. SOB (shortness of breath) [name] does feel that she gets out of breath more easily that she used to when she exercises. [name]'s shortness of breath appears to be obesity related and exercise induced. She has agreed to work on weight loss and gradually increase exercise to treat her exercise induced shortness of breath. Will continue to monitor closely.  3. Prediabetes *** - metFORMIN (GLUCOPHAGE) 500 MG tablet; Take 1 tablet (500 mg total) by mouth daily with breakfast.  Dispense: 30 tablet; Refill: 0 - Insulin, random  4. Status post laparoscopic sleeve gastrectomy (01/01/20) ***  5. Iron deficiency anemia due to chronic blood loss *** - iron polysaccharides (NIFEREX) 150 MG capsule; Take 1 capsule (150 mg total) by mouth daily.  Dispense: 30 capsule; Refill: 0  6. Vitamin D deficiency *** - VITAMIN D 25 Hydroxy (Vit-D Deficiency, Fractures)  7. B12 deficiency *** - Vitamin B12  8. Depression screening [name] had a positive depression screening. Depression is commonly associated with obesity and often results in emotional eating behaviors. We will monitor this closely and work on CBT to help improve the non-hunger eating patterns. Referral to Psychology may be required if no improvement is seen as she continues in our clinic.  9. Morbid obesity (HCC)  10. BMI 40.0-44.9, adult [facility]) [name] is currently in the action stage of change  and her goal is to continue with weight loss efforts. I recommend [name] begin the structured treatment plan as follows:  She has agreed to the Category 2 Plan + 100 calories.  Meal planning and intentional eating  were discussed. Reviewed labs with 06/09/2022, CMP, lipids, CBC, and A1c).   Exercise goals: No exercise has been prescribed at this time.   Behavioral modification strategies: increasing lean protein intake, decreasing simple carbohydrates, increasing vegetables, increasing water intake, decreasing eating out, no skipping meals, meal planning and cooking strategies, keeping healthy foods in the home, and planning for success.  She was informed of the importance of frequent follow-up visits to maximize her success with intensive lifestyle modifications for her multiple health conditions. She was informed we would discuss her lab results at her next visit unless there is a critical issue that needs to be addressed sooner. [name] agreed to keep her next visit at the agreed upon time to discuss these results.  Objective:   Blood pressure 120/85, pulse 85, temperature 98.3 F (36.8 C), height 5\' 2"  (1.575 m), weight 249 lb (112.9 kg), last menstrual period 07/16/2022, SpO2 100 %. Body mass index is 45.54 kg/m.  EKG: Normal sinus rhythm, rate 65 BPM.  Indirect Calorimeter completed today shows a VO2 of 266 and a REE of 1829.  Her calculated basal metabolic rate is 1610 thus her basal metabolic rate is worse than expected.  General: Cooperative, alert, well developed, in no acute distress. HEENT: Conjunctivae and lids unremarkable. Cardiovascular: Regular rhythm.  Lungs: Normal work of breathing. Neurologic: No focal deficits.   Lab Results  Component Value Date   CREATININE 0.69 06/09/2022   BUN 12 06/09/2022   NA 139 06/09/2022   K 4.3 06/09/2022   CL 104 06/09/2022   CO2 21 06/09/2022   Lab Results  Component Value Date   ALT 10 06/09/2022   AST 14 06/09/2022   ALKPHOS 65 06/09/2022   BILITOT 0.3 06/09/2022   Lab Results  Component Value Date   HGBA1C 5.9 (H) 06/09/2022   HGBA1C 5.8 (H) 12/21/2019   Lab Results  Component Value Date   INSULIN 10.0 07/22/2022   Lab Results   Component Value Date   TSH 1.910 08/07/2019   Lab Results  Component Value Date   CHOL 131 06/09/2022   HDL 44 06/09/2022   LDLCALC 76 06/09/2022   TRIG 51 06/09/2022   CHOLHDL 3.0 06/09/2022   Lab Results  Component Value Date   WBC 5.2 06/09/2022   HGB 10.0 (L) 06/09/2022   HCT 33.4 (L) 06/09/2022   MCV 72 (L) 06/09/2022   PLT 431 06/09/2022   Lab Results  Component Value Date   IRON 48 08/07/2019   TIBC 359 08/07/2019   FERRITIN 29 08/07/2019   Attestation Statements:   Reviewed by clinician on day of visit: allergies, medications, problem list, medical history, surgical history, family history, social history, and previous encounter notes.   Trude Mcburney, am acting as Energy manager for [employer], DO.  I have reviewed the above documentation for accuracy and completeness, and I agree with the above. - ***

## 2022-07-29 ENCOUNTER — Encounter: Payer: Self-pay | Admitting: Bariatrics

## 2022-08-05 ENCOUNTER — Ambulatory Visit: Payer: BC Managed Care – PPO | Admitting: Bariatrics

## 2022-08-09 ENCOUNTER — Ambulatory Visit: Payer: BC Managed Care – PPO | Admitting: Bariatrics

## 2022-08-09 ENCOUNTER — Encounter: Payer: Self-pay | Admitting: Bariatrics

## 2022-08-09 VITALS — BP 110/71 | HR 85 | Temp 97.8°F | Ht 62.0 in | Wt 253.0 lb

## 2022-08-09 DIAGNOSIS — E559 Vitamin D deficiency, unspecified: Secondary | ICD-10-CM | POA: Diagnosis not present

## 2022-08-09 DIAGNOSIS — R7303 Prediabetes: Secondary | ICD-10-CM | POA: Diagnosis not present

## 2022-08-09 DIAGNOSIS — F509 Eating disorder, unspecified: Secondary | ICD-10-CM | POA: Insufficient documentation

## 2022-08-09 DIAGNOSIS — F5089 Other specified eating disorder: Secondary | ICD-10-CM | POA: Diagnosis not present

## 2022-08-09 DIAGNOSIS — Z6841 Body Mass Index (BMI) 40.0 and over, adult: Secondary | ICD-10-CM

## 2022-08-09 MED ORDER — VITAMIN D (ERGOCALCIFEROL) 1.25 MG (50000 UNIT) PO CAPS
50000.0000 [IU] | ORAL_CAPSULE | ORAL | 0 refills | Status: DC
Start: 2022-08-09 — End: 2023-05-04

## 2022-08-09 MED ORDER — METFORMIN HCL 500 MG PO TABS
500.0000 mg | ORAL_TABLET | Freq: Two times a day (BID) | ORAL | 0 refills | Status: DC
Start: 2022-08-09 — End: 2023-05-04

## 2022-08-10 NOTE — Progress Notes (Unsigned)
Chief Complaint:   OBESITY [name] is here to discuss her progress with her obesity treatment plan along with follow-up of her obesity related diagnoses. [name] is on the Category 2 Plan + 100 calories and states she is following her eating plan approximately 30% of the time. [name] states she is walking for 45 minutes 2-3 times per week.  Today's visit was #: 2 Starting weight: 249 lbs Starting date: 07/22/2022 Today's weight: 253 lbs Today's date: 08/09/2022 Total lbs lost to date: 0 Total lbs lost since last in-office visit: 0  Interim History: Patient is up 4 pounds since her last visit.  She followed her plan for 1 week but then it fell apart.  She is doing better with her protein.  Subjective:   1. Vitamin D deficiency Patient's recent vitamin D level was 11.3.  2. Prediabetes Patient's recent A1c was 5.9 and insulin 10.  3. Other disorder of eating Patient notes stress and emotional eating.  Assessment/Plan:   1. Vitamin D deficiency Patient agreed to start prescription vitamin D 50,000 IU once weekly with no refills.  - Vitamin D, Ergocalciferol, (DRISDOL) 1.25 MG (50000 UNIT) CAPS capsule; Take 1 capsule (50,000 Units total) by mouth every 7 (seven) days.  Dispense: 5 capsule; Refill: 0  2. Prediabetes Patient agreed to increase metformin to 500 mg twice daily with no refills.  Information handout was provided on insulin resistance and prediabetes.  - metFORMIN (GLUCOPHAGE) 500 MG tablet; Take 1 tablet (500 mg total) by mouth 2 (two) times daily with a meal.  Dispense: 60 tablet; Refill: 0  3. Other disorder of eating Patient was referred to Dr. Dewaine Conger, our Bariatric Psychologist, for evaluation.  4. Morbid obesity (HCC)  5. BMI 45.0-49.9, adult [facility]) [name] is currently in the action stage of change. As such, her goal is to continue with weight loss efforts. She has agreed to the Category 2 Plan + 100 calories.   Meal planning was discussed.  Patient will  adhere to the plan 85-90%.  Review labs with the patient from 07/22/2022, B12, vitamin D, and insulin.  Patient is to work on increasing her water intake.  Exercise goals: As is.   Behavioral modification strategies: increasing lean protein intake, decreasing simple carbohydrates, increasing vegetables, increasing water intake, decreasing eating out, no skipping meals, meal planning and cooking strategies, keeping healthy foods in the home, and planning for success.  [name] has agreed to follow-up with our clinic in 2 weeks. She was informed of the importance of frequent follow-up visits to maximize her success with intensive lifestyle modifications for her multiple health conditions.   Objective:   Blood pressure 110/71, pulse 85, temperature 97.8 F (36.6 C), height 5\' 2"  (1.575 m), weight 253 lb (114.8 kg), last menstrual period 07/16/2022, SpO2 100 %. Body mass index is 46.27 kg/m.  General: Cooperative, alert, well developed, in no acute distress. HEENT: Conjunctivae and lids unremarkable. Cardiovascular: Regular rhythm.  Lungs: Normal work of breathing. Neurologic: No focal deficits.   Lab Results  Component Value Date   CREATININE 0.69 06/09/2022   BUN 12 06/09/2022   NA 139 06/09/2022   K 4.3 06/09/2022   CL 104 06/09/2022   CO2 21 06/09/2022   Lab Results  Component Value Date   ALT 10 06/09/2022   AST 14 06/09/2022   ALKPHOS 65 06/09/2022   BILITOT 0.3 06/09/2022   Lab Results  Component Value Date   HGBA1C 5.9 (H) 06/09/2022   HGBA1C 5.8 (H)  12/21/2019   Lab Results  Component Value Date   INSULIN 10.0 07/22/2022   Lab Results  Component Value Date   TSH 1.910 08/07/2019   Lab Results  Component Value Date   CHOL 131 06/09/2022   HDL 44 06/09/2022   LDLCALC 76 06/09/2022   TRIG 51 06/09/2022   CHOLHDL 3.0 06/09/2022   Lab Results  Component Value Date   VD25OH 11.3 (L) 07/22/2022   VD25OH 7.1 (L) 08/07/2019   Lab Results  Component Value Date    WBC 5.2 06/09/2022   HGB 10.0 (L) 06/09/2022   HCT 33.4 (L) 06/09/2022   MCV 72 (L) 06/09/2022   PLT 431 06/09/2022   Lab Results  Component Value Date   IRON 48 08/07/2019   TIBC 359 08/07/2019   FERRITIN 29 08/07/2019   Attestation Statements:   Reviewed by clinician on day of visit: allergies, medications, problem list, medical history, surgical history, family history, social history, and previous encounter notes.   Trude Mcburney, am acting as Energy manager for [employer], DO.  I have reviewed the above documentation for accuracy and completeness, and I agree with the above. Corinna Capra, DO

## 2022-08-11 ENCOUNTER — Encounter: Payer: Self-pay | Admitting: Bariatrics

## 2022-08-14 ENCOUNTER — Other Ambulatory Visit: Payer: Self-pay | Admitting: Bariatrics

## 2022-08-14 DIAGNOSIS — D500 Iron deficiency anemia secondary to blood loss (chronic): Secondary | ICD-10-CM

## 2022-08-16 ENCOUNTER — Encounter (HOSPITAL_COMMUNITY): Payer: Self-pay | Admitting: *Deleted

## 2022-08-31 ENCOUNTER — Ambulatory Visit: Payer: BC Managed Care – PPO | Admitting: Nurse Practitioner

## 2022-09-01 ENCOUNTER — Other Ambulatory Visit: Payer: Self-pay | Admitting: Bariatrics

## 2022-09-01 DIAGNOSIS — R7303 Prediabetes: Secondary | ICD-10-CM

## 2022-09-20 ENCOUNTER — Encounter (INDEPENDENT_AMBULATORY_CARE_PROVIDER_SITE_OTHER): Payer: Self-pay | Admitting: Psychology

## 2022-09-20 ENCOUNTER — Telehealth (INDEPENDENT_AMBULATORY_CARE_PROVIDER_SITE_OTHER): Payer: Self-pay | Admitting: Psychology

## 2022-09-20 ENCOUNTER — Encounter (INDEPENDENT_AMBULATORY_CARE_PROVIDER_SITE_OTHER): Payer: Self-pay

## 2022-09-20 NOTE — Progress Notes (Signed)
Entered in error

## 2022-09-20 NOTE — Telephone Encounter (Signed)
  Office: [phone]  /  Fax: [phone]  Date of Encounter: September 20, 2022  Time of Encounter: 12:02pm Provider: Lawerance Cruel, PsyD  CONTENT: [name] presented for today's appointment via MyChart Video Visit with audio capabilities only. This provider requested she turn on her video. She was observed in the car and stated two children were present in the car (two year old and one month old). This provider expressed concern regarding confidentiality and attempted to discuss options; however, [name] left the appointment. This provider called [name] at 12:06pm as it seemed to be a connection error and the call went to voicemail. This provider could not leave a voicemail as her mailbox was full. As such, this provider attempted to call again at 12:07pm and [name] indicated she intentionally left the appointment because this provider discussed confidentiality concerns due to children being present, which she reportedly did not agree with. This provider attempted to explain that the goal was to determine the best option given the situation; however, [name] indicated, "I don't care to talk to ya'll. Go ahead and cancel the appointment." She immediately hung up. No evidence or endorsement of safety concerns.  PLAN: No further follow-up planned by this provider.

## 2022-12-02 ENCOUNTER — Ambulatory Visit: Payer: BC Managed Care – PPO | Admitting: Family Medicine

## 2022-12-06 ENCOUNTER — Ambulatory Visit: Payer: BC Managed Care – PPO | Admitting: Bariatrics

## 2022-12-12 ENCOUNTER — Other Ambulatory Visit: Payer: Self-pay

## 2022-12-12 ENCOUNTER — Ambulatory Visit (HOSPITAL_COMMUNITY)
Admission: EM | Admit: 2022-12-12 | Discharge: 2022-12-12 | Disposition: A | Discharge status: Home / Self Care | Payer: BC Managed Care – PPO | Attending: Internal Medicine | Admitting: Internal Medicine

## 2022-12-12 ENCOUNTER — Encounter (HOSPITAL_COMMUNITY): Payer: Self-pay | Admitting: *Deleted

## 2022-12-12 DIAGNOSIS — N3001 Acute cystitis with hematuria: Secondary | ICD-10-CM | POA: Diagnosis present

## 2022-12-12 LAB — POCT URINALYSIS DIP (MANUAL ENTRY)
Glucose, UA: NEGATIVE mg/dL
Leukocytes, UA: NEGATIVE
Nitrite, UA: NEGATIVE
Protein Ur, POC: 100 mg/dL — AB
Spec Grav, UA: 1.025 (ref 1.010–1.025)
Urobilinogen, UA: 1 U/dL
pH, UA: 7 (ref 5.0–8.0)

## 2022-12-12 MED ORDER — SULFAMETHOXAZOLE-TRIMETHOPRIM 800-160 MG PO TABS: 1.0000 | ORAL_TABLET | Freq: Two times a day (BID) | ORAL | 0 refills | Status: AC

## 2022-12-12 NOTE — ED Provider Notes (Signed)
[facility]    CSN: [id number] Arrival date & time: 12/12/22  1636      History   Chief Complaint Chief Complaint  Patient presents with   Urinary Frequency   Hematuria    HPI [name] is a 27 y.o. female.   Patient presents to clinic for complaints of urinary frequency, urgency, hematuria and suprapubic pressure.  Noticed some blood after wiping yesterday.  Today her urine has turned bloody.  She recently got off her menstrual cycle on Monday, so she is confident it is not vaginal bleeding.   Denies dysuria.  No fevers.  No flank pain.  No nausea or vomiting.  The history is provided by the patient and medical records.  Urinary Frequency  Hematuria    Past Medical History:  Diagnosis Date   Anemia    iron   Asthma    spring   Back pain    Joint pain    Lower extremity edema    PCOS (polycystic ovarian syndrome)    Pre-diabetes    Prediabetes    Seasonal allergies    SOB (shortness of breath)     Patient Active Problem List   Diagnosis Date Noted   Eating disorder 08/09/2022   Morbid obesity (HCC) 08/09/2022   Obesity, Class III, BMI 40-49.9 (morbid obesity) (HCC) 08/09/2022   Status post laparoscopic sleeve gastrectomy (01/01/20) 03/09/2020   Mild obstructive sleep apnea 01/01/2020   Vitamin D deficiency 09/04/2019   PCOS (polycystic ovarian syndrome) 09/04/2019   Depression 09/04/2019   Class 3 severe obesity with serious comorbidity and body mass index (BMI) of 45.0 to 49.9 in adult [facility]) 09/04/2019   Prediabetes 09/04/2019   Absolute anemia 09/04/2019    Past Surgical History:  Procedure Laterality Date   FEMUR FRACTURE SURGERY Right 2010   LAPAROSCOPIC GASTRIC SLEEVE RESECTION N/A 01/01/2020   Procedure: LAPAROSCOPIC GASTRIC SLEEVE RESECTION;  Surgeon: Gaynelle Adu, MD;  Location: [facility];  Service: General;  Laterality: N/A;   UPPER GI ENDOSCOPY N/A 01/01/2020   Procedure: UPPER GI ENDOSCOPY;  Surgeon: Gaynelle Adu, MD;   Location: [facility];  Service: General;  Laterality: N/A;    OB History     Gravida  0   Para  0   Term  0   Preterm  0   AB  0   Living  0      SAB  0   IAB  0   Ectopic  0   Multiple  0   Live Births               Home Medications    Prior to Admission medications   Medication Sig Start Date End Date Taking? Authorizing Provider  iron polysaccharides (NIFEREX) 150 MG capsule TAKE 1 CAPSULE BY MOUTH EVERY DAY 08/16/22  Yes Corinna Capra A, DO  metFORMIN (GLUCOPHAGE) 500 MG tablet Take 1 tablet (500 mg total) by mouth 2 (two) times daily with a meal. 08/09/22  Yes Corinna Capra A, DO  sulfamethoxazole-trimethoprim (BACTRIM DS) 800-160 MG tablet Take 1 tablet by mouth 2 (two) times daily for 5 days. 12/12/22 12/17/22 Yes Rinaldo Ratel, [place] N, FNP  Vitamin D, Ergocalciferol, (DRISDOL) 1.25 MG (50000 UNIT) CAPS capsule Take 1 capsule (50,000 Units total) by mouth every 7 (seven) days. 08/09/22  Yes Corinna Capra A, DO  albuterol (PROVENTIL) (2.5 MG/3ML) 0.083% nebulizer solution Take 3 mLs (2.5 mg total) by nebulization every 6 (six) hours as needed for wheezing or shortness  of breath. 01/26/22 04/26/22  Theadora Rama Scales, PA-C    Family History Family History  Problem Relation Age of Onset   Diabetes Mother    Diabetes Father    Hypertension Father    Sleep apnea Father    Obesity Father    Cancer Other     Social History Social History   Tobacco Use   Smoking status: Never    Passive exposure: Never   Smokeless tobacco: Never  Vaping Use   Vaping status: Never Used  Substance Use Topics   Alcohol use: Yes    Comment: socially   Drug use: No     Allergies   Contrast media [iodinated contrast media] and Isovue [iopamidol]   Review of Systems Review of Systems  Per HPI   Physical Exam Triage Vital Signs ED Triage Vitals  Encounter Vitals Group     BP 12/12/22 1652 122/83     Systolic BP Percentile --      Diastolic BP Percentile --       Pulse Rate 12/12/22 1652 75     Resp 12/12/22 1652 18     Temp 12/12/22 1652 97.7 F (36.5 C)     Temp src --      SpO2 12/12/22 1652 99 %     Weight --      Height --      Head Circumference --      Peak Flow --      Pain Score 12/12/22 1650 0     Pain Loc --      Pain Education --      Exclude from Growth Chart --    No data found.  Updated Vital Signs BP 122/83   Pulse 75   Temp 97.7 F (36.5 C)   Resp 18   LMP 12/01/2022   SpO2 99%   Visual Acuity Right Eye Distance:   Left Eye Distance:   Bilateral Distance:    Right Eye Near:   Left Eye Near:    Bilateral Near:     Physical Exam Vitals and nursing note reviewed.  Constitutional:      Appearance: Normal appearance.  HENT:     Head: Normocephalic and atraumatic.     Right Ear: External ear normal.     Left Ear: External ear normal.     Nose: Nose normal.     Mouth/Throat:     Mouth: Mucous membranes are moist.  Eyes:     Conjunctiva/sclera: Conjunctivae normal.  Cardiovascular:     Rate and Rhythm: Normal rate.  Pulmonary:     Effort: Pulmonary effort is normal. No respiratory distress.  Abdominal:     Tenderness: There is no right CVA tenderness or left CVA tenderness.  Musculoskeletal:        General: Normal range of motion.  Skin:    General: Skin is warm and dry.  Neurological:     General: No focal deficit present.     Mental Status: She is alert.  Psychiatric:        Mood and Affect: Mood normal.      UC Treatments / Results  Labs (all labs ordered are listed, but only abnormal results are displayed) Labs Reviewed  POCT URINALYSIS DIP (MANUAL ENTRY) - Abnormal; Notable for the following components:      Result Value   Color, UA red (*)    Clarity, UA cloudy (*)    Bilirubin, UA small (*)    Ketones, POC  UA trace (5) (*)    Blood, UA large (*)    Protein Ur, POC =100 (*)    All other components within normal limits  URINE CULTURE    EKG   Radiology No results  found.  Procedures Procedures (including critical care time)  Medications Ordered in UC Medications - No data to display  Initial Impression / Assessment and Plan / UC Course  I have reviewed the triage vital signs and the nursing notes.  Pertinent labs & imaging results that were available during my care of the patient were reviewed by me and considered in my medical decision making (see chart for details).  Vitals and triage reviewed, patient is hemodynamically stable.  Negative for CVA tenderness.  Urinalysis with large red blood cells, small bilirubin, trace ketones and protein.  Negative for leukocytes or nitrates.  Low suspicion for pyelonephritis, will treat for acute cystitis with hematuria with Bactrim.  Symptomatic management discussed.  Plan of care, follow-up care return precautions given, no questions at this time.  Work note provided.     Final Clinical Impressions(s) / UC Diagnoses   Final diagnoses:  Acute cystitis with hematuria     Discharge Instructions      Take all antibiotics as prescribed and until finished, you can take them with food to prevent gastrointestinal upset.  If you develop any burning with urination you can take over-the-counter Azo, this will change the color of your secretions.  Ensure you are staying well-hydrated and drinking plenty of water.  We have sent your urine off for culture and we will contact you if any treatment modifications need to be made.  Return to clinic if you develop any vomiting, flank pain or fevers, or no improvement despite antibiotics.     ED Prescriptions     Medication Sig Dispense Auth. Provider   sulfamethoxazole-trimethoprim (BACTRIM DS) 800-160 MG tablet Take 1 tablet by mouth 2 (two) times daily for 5 days. 10 tablet Zyad Boomer, [place] N, FNP      PDMP not reviewed this encounter.   Othar Curto, [place] N, [place] 12/12/22 [phone]

## 2022-12-12 NOTE — ED Triage Notes (Signed)
Pt reports she feels like she has to pee every 5 sec. the patient has seen blood in Urine.

## 2022-12-12 NOTE — Discharge Instructions (Addendum)
Take all antibiotics as prescribed and until finished, you can take them with food to prevent gastrointestinal upset.  If you develop any burning with urination you can take over-the-counter Azo, this will change the color of your secretions.  Ensure you are staying well-hydrated and drinking plenty of water.  We have sent your urine off for culture and we will contact you if any treatment modifications need to be made.  Return to clinic if you develop any vomiting, flank pain or fevers, or no improvement despite antibiotics.

## 2022-12-14 LAB — URINE CULTURE: Culture: 60000 — AB

## 2023-01-25 ENCOUNTER — Encounter: Payer: Self-pay | Admitting: Family Medicine

## 2023-01-25 ENCOUNTER — Ambulatory Visit: Payer: BC Managed Care – PPO | Admitting: Family Medicine

## 2023-01-25 VITALS — BP 111/73 | HR 84 | Temp 98.0°F | Resp 18 | Ht 62.0 in | Wt 264.3 lb

## 2023-01-25 DIAGNOSIS — N898 Other specified noninflammatory disorders of vagina: Secondary | ICD-10-CM

## 2023-01-25 MED ORDER — METRONIDAZOLE 500 MG PO TABS
500.0000 mg | ORAL_TABLET | Freq: Two times a day (BID) | ORAL | 0 refills | Status: AC
Start: 2023-01-25 — End: 2023-02-01

## 2023-01-25 NOTE — Progress Notes (Signed)
   Acute Office Visit  Subjective:     Patient ID: [name], female    DOB: [birth year], 27 y.o.   MRN: [id number]  No chief complaint on file.   HPI Patient is in today for acute visit.  Pt reports she had a UTI a month ago. She was given Bactrim for her UTI. She says her symptoms improved but a week ago, she started having vaginal irritation and itching. She also reports some vaginal discharge. She is sexually active and doesn't always use protection.    Review of Systems  Genitourinary:  Negative for dysuria, frequency and urgency.       Vaginal discharge, vaginal itching and irritation  All other systems reviewed and are negative.      Objective:    There were no vitals taken for this visit.   Physical Exam Vitals and nursing note reviewed. Exam conducted with a chaperone present.  Constitutional:      Appearance: Normal appearance. She is obese.  HENT:     Head: Normocephalic and atraumatic.     Right Ear: External ear normal.     Left Ear: External ear normal.     Nose: Nose normal.     Mouth/Throat:     Mouth: Mucous membranes are moist.     Pharynx: Oropharynx is clear.  Eyes:     Conjunctiva/sclera: Conjunctivae normal.     Pupils: Pupils are equal, round, and reactive to light.  Cardiovascular:     Rate and Rhythm: Normal rate.  Pulmonary:     Effort: Pulmonary effort is normal.  Genitourinary:    Vagina: Vaginal discharge present.     Comments: Labia majora erythematous and edematous with white watery discharge Skin:    General: Skin is warm.     Capillary Refill: Capillary refill takes less than 2 seconds.  Neurological:     General: No focal deficit present.     Mental Status: She is alert and oriented to person, place, and time. Mental status is at baseline.  Psychiatric:        Mood and Affect: Mood normal.        Behavior: Behavior normal.        Thought Content: Thought content normal.        Judgment: Judgment normal.   No results  found for any visits on 01/25/23.      Assessment & Plan:   Problem List Items Addressed This Visit   None Vaginal irritation -     NuSwab VG+, HSV -     metroNIDAZOLE; Take 1 tablet (500 mg total) by mouth 2 (two) times daily for 7 days.  Dispense: 14 tablet; Refill: 0  Vaginal itching -     NuSwab VG+, HSV -     metroNIDAZOLE; Take 1 tablet (500 mg total) by mouth 2 (two) times daily for 7 days.  Dispense: 14 tablet; Refill: 0   Due to symptoms and sexually active, send for nu swab to rule out yeast, BV and STDs.  To treat empirically with Flagyl 500mg  BID x 7 days pending results.    No orders of the defined types were placed in this encounter.   No follow-ups on file.  Suzan Slick, MD

## 2023-01-28 ENCOUNTER — Other Ambulatory Visit: Payer: Self-pay | Admitting: Family Medicine

## 2023-01-28 LAB — NUSWAB VG+, HSV
Atopobium vaginae: HIGH {score} — AB
BVAB 2: HIGH {score} — AB
Candida albicans, NAA: POSITIVE — AB
Candida glabrata, NAA: NEGATIVE
Chlamydia trachomatis, NAA: NEGATIVE
HSV 1 NAA: NEGATIVE
HSV 2 NAA: NEGATIVE
Megasphaera 1: HIGH {score} — AB
Neisseria gonorrhoeae, NAA: NEGATIVE
Trich vag by NAA: NEGATIVE

## 2023-01-28 MED ORDER — FLUCONAZOLE 150 MG PO TABS: 150.0000 mg | ORAL_TABLET | Freq: Once | ORAL | 0 refills | Status: AC

## 2023-02-11 ENCOUNTER — Other Ambulatory Visit: Payer: Self-pay | Admitting: Bariatrics

## 2023-02-11 DIAGNOSIS — R7303 Prediabetes: Secondary | ICD-10-CM

## 2023-02-11 DIAGNOSIS — E559 Vitamin D deficiency, unspecified: Secondary | ICD-10-CM

## 2023-02-17 ENCOUNTER — Other Ambulatory Visit: Payer: Self-pay | Admitting: Bariatrics

## 2023-02-17 DIAGNOSIS — E559 Vitamin D deficiency, unspecified: Secondary | ICD-10-CM

## 2023-03-31 ENCOUNTER — Ambulatory Visit: Payer: 59 | Admitting: Family Medicine

## 2023-04-06 ENCOUNTER — Ambulatory Visit (INDEPENDENT_AMBULATORY_CARE_PROVIDER_SITE_OTHER): Payer: 59 | Admitting: Family Medicine

## 2023-04-06 VITALS — BP 121/81 | HR 80 | Temp 98.5°F | Resp 18 | Ht 62.0 in | Wt 272.8 lb

## 2023-04-06 DIAGNOSIS — Z6841 Body Mass Index (BMI) 40.0 and over, adult: Secondary | ICD-10-CM | POA: Diagnosis not present

## 2023-04-06 DIAGNOSIS — R635 Abnormal weight gain: Secondary | ICD-10-CM

## 2023-04-06 DIAGNOSIS — R7302 Impaired glucose tolerance (oral): Secondary | ICD-10-CM | POA: Diagnosis not present

## 2023-04-06 MED ORDER — PHENTERMINE HCL 37.5 MG PO CAPS
37.5000 mg | ORAL_CAPSULE | ORAL | 0 refills | Status: DC
Start: 2023-04-06 — End: 2023-05-04

## 2023-04-06 NOTE — Progress Notes (Signed)
 Established Patient Office Visit  Subjective   Patient ID: [name], female    DOB: [birth year]  Age: 28 y.o. MRN: [id number]  Chief Complaint  Patient presents with   Weight Loss    Patient is here for weight loss medication    HPI  Weight management Pt reports she has been trying to lose weight. She has been working out and dieting. She is on 1200 calories a day for 5-6 days a week. She was seeing [facility] but the schedule was difficult for her to make due to work. She was tried on Metformin 500mg  daily for prediabetes and weight management, but didn't help. She is looking to start a weight loss aide.    Review of Systems  Constitutional:        Weight gain  All other systems reviewed and are negative.    Objective:     BP 121/81   Pulse 80   Temp 98.5 F (36.9 C) (Oral)   Resp 18   Ht 5\' 2"  (1.575 m)   Wt 272 lb 12.8 oz (123.7 kg)   SpO2 100%   BMI 49.90 kg/m  BP Readings from Last 3 Encounters:  04/06/23 121/81  01/25/23 111/73  12/12/22 122/83      Physical Exam Vitals and nursing note reviewed.  Constitutional:      Appearance: Normal appearance. She is obese.  HENT:     Head: Normocephalic and atraumatic.     Right Ear: External ear normal.     Left Ear: External ear normal.     Nose: Nose normal.     Mouth/Throat:     Mouth: Mucous membranes are moist.     Pharynx: Oropharynx is clear.  Eyes:     Conjunctiva/sclera: Conjunctivae normal.     Pupils: Pupils are equal, round, and reactive to light.  Cardiovascular:     Rate and Rhythm: Normal rate.  Pulmonary:     Effort: Pulmonary effort is normal.  Neurological:     General: No focal deficit present.     Mental Status: She is alert and oriented to person, place, and time. Mental status is at baseline.  Psychiatric:        Mood and Affect: Mood normal.        Behavior: Behavior normal.        Thought Content: Thought content normal.        Judgment: Judgment normal.    No results  found for any visits on 04/06/23.  Last hemoglobin A1c Lab Results  Component Value Date   HGBA1C 5.9 (H) 06/09/2022   Last thyroid functions Lab Results  Component Value Date   TSH 1.910 08/07/2019   T3TOTAL 139 08/07/2019      The ASCVD Risk score (Arnett DK, et al., 2019) failed to calculate for the following reasons:   The 2019 ASCVD risk score is only valid for ages 78 to 24    Assessment & Plan:   Problem List Items Addressed This Visit   None Weight gain -     Phentermine HCl; Take 1 capsule (37.5 mg total) by mouth every morning.  Dispense: 30 capsule; Refill: 0  Body mass index (BMI) of 45.0 to 49.9 in adult [facility]) -     Phentermine HCl; Take 1 capsule (37.5 mg total) by mouth every morning.  Dispense: 30 capsule; Refill: 0  Impaired glucose tolerance -     Hemoglobin A1c   Discussed different options for weight loss medication.  Pt would like to try Phentermine 37.5mg  daily. Have discussed possible side effects of this medicine along with the max duration of therapy being 12 weeks due to potential effects on the heart. She is open to trying this medicine understanding the risk. Will repeat A1c along with following up in 4 weeks, sooner if she has issues with the medicine.  No follow-ups on file.    Suzan Slick, MD

## 2023-04-07 ENCOUNTER — Encounter: Payer: Self-pay | Admitting: Family Medicine

## 2023-04-07 LAB — HEMOGLOBIN A1C
Est. average glucose Bld gHb Est-mCnc: 114 mg/dL
Hgb A1c MFr Bld: 5.6 % (ref 4.8–5.6)

## 2023-04-27 ENCOUNTER — Ambulatory Visit
Admission: EM | Admit: 2023-04-27 | Discharge: 2023-04-27 | Disposition: A | Discharge status: Home / Self Care | Attending: Family Medicine | Admitting: Family Medicine

## 2023-04-27 DIAGNOSIS — R0981 Nasal congestion: Secondary | ICD-10-CM | POA: Diagnosis not present

## 2023-04-27 DIAGNOSIS — J101 Influenza due to other identified influenza virus with other respiratory manifestations: Secondary | ICD-10-CM

## 2023-04-27 DIAGNOSIS — J309 Allergic rhinitis, unspecified: Secondary | ICD-10-CM

## 2023-04-27 LAB — POCT INFLUENZA A/B
Influenza A, POC: POSITIVE — AB
Influenza B, POC: NEGATIVE

## 2023-04-27 MED ORDER — FEXOFENADINE HCL 180 MG PO TABS: 180.0000 mg | ORAL_TABLET | Freq: Every day | ORAL | 0 refills | Status: AC

## 2023-04-27 MED ORDER — OSELTAMIVIR PHOSPHATE 75 MG PO CAPS: 75.0000 mg | ORAL_CAPSULE | Freq: Two times a day (BID) | ORAL | 0 refills | Status: DC

## 2023-04-27 MED ORDER — PREDNISONE 20 MG PO TABS: ORAL_TABLET | ORAL | 0 refills | Status: DC

## 2023-04-27 NOTE — ED Provider Notes (Signed)
 [facility]    CSN: [id number] Arrival date & time: 04/27/23  1643      History   Chief Complaint Chief Complaint  Patient presents with   Nasal Congestion   Sore Throat    HPI [name] is a 28 y.o. female.   HPI 28 year old female presents with nasal congestion, possible sinus infection, sore throat, nasal drainage and bilateral ear fullness with cervical load tenderness.  PMH significant for morbid obesity, PCOS and back pain.  Past Medical History:  Diagnosis Date   Anemia    iron   Asthma    spring   Back pain    Joint pain    Lower extremity edema    PCOS (polycystic ovarian syndrome)    Pre-diabetes    Prediabetes    Seasonal allergies    SOB (shortness of breath)     Patient Active Problem List   Diagnosis Date Noted   Eating disorder 08/09/2022   Morbid obesity (HCC) 08/09/2022   Obesity, Class III, BMI 40-49.9 (morbid obesity) (HCC) 08/09/2022   Status post laparoscopic sleeve gastrectomy (01/01/20) 03/09/2020   Mild obstructive sleep apnea 01/01/2020   Vitamin D deficiency 09/04/2019   PCOS (polycystic ovarian syndrome) 09/04/2019   Depression 09/04/2019   Class 3 severe obesity with serious comorbidity and body mass index (BMI) of 45.0 to 49.9 in adult [facility]) 09/04/2019   Prediabetes 09/04/2019   Absolute anemia 09/04/2019    Past Surgical History:  Procedure Laterality Date   FEMUR FRACTURE SURGERY Right 2010   LAPAROSCOPIC GASTRIC SLEEVE RESECTION N/A 01/01/2020   Procedure: LAPAROSCOPIC GASTRIC SLEEVE RESECTION;  Surgeon: Gaynelle Adu, MD;  Location: [facility];  Service: General;  Laterality: N/A;   UPPER GI ENDOSCOPY N/A 01/01/2020   Procedure: UPPER GI ENDOSCOPY;  Surgeon: Gaynelle Adu, MD;  Location: [facility];  Service: General;  Laterality: N/A;    OB History     Gravida  0   Para  0   Term  0   Preterm  0   AB  0   Living  0      SAB  0   IAB  0   Ectopic  0   Multiple  0   Live Births                Home Medications    Prior to Admission medications   Medication Sig Start Date End Date Taking? Authorizing Provider  fexofenadine (ALLEGRA ALLERGY) 180 MG tablet Take 1 tablet (180 mg total) by mouth daily for 15 days. 04/27/23 05/12/23 Yes Trevor Iha, FNP  oseltamivir (TAMIFLU) 75 MG capsule Take 1 capsule (75 mg total) by mouth every 12 (twelve) hours. 04/27/23  Yes Trevor Iha, FNP  predniSONE (DELTASONE) 20 MG tablet Take 3 tabs PO daily x 5 days. 04/27/23  Yes Trevor Iha, FNP  albuterol (PROVENTIL) (2.5 MG/3ML) 0.083% nebulizer solution Take 3 mLs (2.5 mg total) by nebulization every 6 (six) hours as needed for wheezing or shortness of breath. 01/26/22 04/26/22  Theadora Rama Scales, PA-C  iron polysaccharides (NIFEREX) 150 MG capsule TAKE 1 CAPSULE BY MOUTH EVERY DAY 08/16/22   Corinna Capra A, DO  metFORMIN (GLUCOPHAGE) 500 MG tablet Take 1 tablet (500 mg total) by mouth 2 (two) times daily with a meal. 08/09/22   Corinna Capra A, DO  phentermine 37.5 MG capsule Take 1 capsule (37.5 mg total) by mouth every morning. 04/06/23   Suzan Slick, MD  Vitamin D, Ergocalciferol, (  DRISDOL) 1.25 MG (50000 UNIT) CAPS capsule Take 1 capsule (50,000 Units total) by mouth every 7 (seven) days. 08/09/22   Roswell Nickel, DO    Family History Family History  Problem Relation Age of Onset   Diabetes Mother    Diabetes Father    Hypertension Father    Sleep apnea Father    Obesity Father    Cancer Other     Social History Social History   Tobacco Use   Smoking status: Never    Passive exposure: Never   Smokeless tobacco: Never  Vaping Use   Vaping status: Never Used  Substance Use Topics   Alcohol use: Yes    Comment: socially   Drug use: No     Allergies   Contrast media [iodinated contrast media] and Isovue [iopamidol]   Review of Systems Review of Systems  HENT:  Positive for congestion.   All other systems reviewed and are negative.    Physical Exam Triage  Vital Signs ED Triage Vitals  Encounter Vitals Group     BP      Systolic BP Percentile      Diastolic BP Percentile      Pulse      Resp      Temp      Temp src      SpO2      Weight      Height      Head Circumference      Peak Flow      Pain Score      Pain Loc      Pain Education      Exclude from Growth Chart    No data found.  Updated Vital Signs BP 122/83 (BP Location: Left Arm)   Pulse (!) 105   Temp 99.1 F (37.3 C) (Oral)   Resp 18   SpO2 100%     Physical Exam Vitals and nursing note reviewed.  Constitutional:      General: She is not in acute distress.    Appearance: Normal appearance. She is obese. She is not ill-appearing.  HENT:     Head: Normocephalic and atraumatic.     Right Ear: Tympanic membrane, ear canal and external ear normal.     Left Ear: Tympanic membrane, ear canal and external ear normal.     Mouth/Throat:     Mouth: Mucous membranes are moist.     Pharynx: Oropharynx is clear.  Eyes:     Extraocular Movements: Extraocular movements intact.     Conjunctiva/sclera: Conjunctivae normal.     Pupils: Pupils are equal, round, and reactive to light.  Cardiovascular:     Rate and Rhythm: Normal rate and regular rhythm.     Pulses: Normal pulses.     Heart sounds: Normal heart sounds.  Pulmonary:     Effort: Pulmonary effort is normal.     Breath sounds: Normal breath sounds. No wheezing, rhonchi or rales.  Musculoskeletal:        General: Normal range of motion.     Cervical back: Normal range of motion and neck supple.  Skin:    General: Skin is warm and dry.  Neurological:     General: No focal deficit present.     Mental Status: She is alert and oriented to person, place, and time. Mental status is at baseline.  Psychiatric:        Mood and Affect: Mood normal.        Behavior:  Behavior normal.      UC Treatments / Results  Labs (all labs ordered are listed, but only abnormal results are displayed) Labs Reviewed  POCT  INFLUENZA A/B - Abnormal; Notable for the following components:      Result Value   Influenza A, POC Positive (*)    All other components within normal limits  POC SARS CORONAVIRUS 2 AG -  ED    EKG   Radiology No results found.  Procedures Procedures (including critical care time)  Medications Ordered in UC Medications - No data to display  Initial Impression / Assessment and Plan / UC Course  I have reviewed the triage vital signs and the nursing notes.  Pertinent labs & imaging results that were available during my care of the patient were reviewed by me and considered in my medical decision making (see chart for details).     MDM: 1.  Influenza A-Rx'd Tamiflu 75 mg capsule: Take 1 capsule twice daily x 6 7 days; 2.  Congestion of nasal sinus-Rx'd prednisone 20 mg tablet: Take 3 tablets p.o. daily x 5 days; 3.  Allergic rhinitis, unspecified seasonality, unspecified trigger-Rx'd Allegra 180 mg tablet: Take 1 tablet daily x 5 days, then as needed. Advised patient to take medications as directed with food to completion.  Advised patient to take Allegra and prednisone with first dose of Tamiflu daily for the next 5 days.  Advised may take Allegra as needed afterwards for concurrent postnasal drainage/drip or runny nose.  Encouraged to increase daily water intake to 64 ounces per day while taking these medications.  Advised if symptoms worsen and/or unresolved please follow-up with your PCP or here for further evaluation. Final Clinical Impressions(s) / UC Diagnoses   Final diagnoses:  Congestion of nasal sinus  Allergic rhinitis, unspecified seasonality, unspecified trigger  Influenza A     Discharge Instructions      Advised patient to take medications as directed with food to completion.  Advised patient to take Allegra and prednisone with first dose of Tamiflu daily for the next 5 days.  Advised may take Allegra as needed afterwards for concurrent postnasal drainage/drip or  runny nose.  Encouraged to increase daily water intake to 64 ounces per day while taking these medications.  Advised if symptoms worsen and/or unresolved please follow-up with your PCP or here for further evaluation.     ED Prescriptions     Medication Sig Dispense Auth. Provider   predniSONE (DELTASONE) 20 MG tablet Take 3 tabs PO daily x 5 days. 15 tablet Trevor Iha, FNP   fexofenadine [facility] ALLERGY) 180 MG tablet Take 1 tablet (180 mg total) by mouth daily for 15 days. 15 tablet Trevor Iha, FNP   oseltamivir (TAMIFLU) 75 MG capsule Take 1 capsule (75 mg total) by mouth every 12 (twelve) hours. 10 capsule Trevor Iha, FNP      PDMP not reviewed this encounter.   Trevor Iha, FNP 04/27/23 1806

## 2023-04-27 NOTE — ED Triage Notes (Addendum)
 Pt presents with concern for sinus infection or allergy exacerbation. C/o sore throat, nasal congestion/drainage, bilat ear fullness (mild pain), dry mouth, and cervical lymph node tenderness. States sore throat does not feel similar to previous strep infections. Able to swallow without pain.

## 2023-04-27 NOTE — Discharge Instructions (Addendum)
 Advised patient to take medications as directed with food to completion.  Advised patient to take Allegra and prednisone with first dose of Tamiflu daily for the next 5 days.  Advised may take Allegra as needed afterwards for concurrent postnasal drainage/drip or runny nose.  Encouraged to increase daily water intake to 64 ounces per day while taking these medications.  Advised if symptoms worsen and/or unresolved please follow-up with your PCP or here for further evaluation.

## 2023-05-04 ENCOUNTER — Encounter: Payer: Self-pay | Admitting: Family Medicine

## 2023-05-04 ENCOUNTER — Ambulatory Visit (INDEPENDENT_AMBULATORY_CARE_PROVIDER_SITE_OTHER): Payer: 59 | Admitting: Family Medicine

## 2023-05-04 DIAGNOSIS — Z6841 Body Mass Index (BMI) 40.0 and over, adult: Secondary | ICD-10-CM | POA: Diagnosis not present

## 2023-05-04 DIAGNOSIS — R635 Abnormal weight gain: Secondary | ICD-10-CM

## 2023-05-04 MED ORDER — PHENTERMINE HCL 37.5 MG PO CAPS: 37.5000 mg | ORAL_CAPSULE | ORAL | 0 refills | Status: DC

## 2023-05-04 NOTE — Progress Notes (Signed)
   Established Patient Office Visit  Subjective   Patient ID: [name], female    DOB: 02/15/1996  Age: 28 y.o. MRN: [id number]  Chief Complaint  Patient presents with   Follow-up    Patient is here for a 4 week follow up for weight management , patient states that she has no concerns regarding medication that she started Phentermine  37.5 mg.     HPI  Weight management Pt was started on Adipex 37.5mg  daily 4 weeks ago. She has lost 10 lbs. She reported dry mouth at first but not bad. No issues with insomnia with the medicine. Denies palpitations or mood instability.    Review of Systems  Constitutional:  Positive for weight loss.  All other systems reviewed and are negative.     Objective:     BP 106/70   Pulse 85   Temp 98.1 F (36.7 C) (Oral)   Resp 18   Ht 5\' 2"  (1.575 m)   Wt 262 lb 1.6 oz (118.9 kg)   SpO2 100%   BMI 47.94 kg/m  BP Readings from Last 3 Encounters:  05/04/23 106/70  04/27/23 122/83  04/06/23 121/81      Physical Exam Vitals and nursing note reviewed.  Constitutional:      Appearance: Normal appearance. She is obese.  HENT:     Head: Normocephalic and atraumatic.     Right Ear: External ear normal.     Left Ear: External ear normal.     Nose: Nose normal.     Mouth/Throat:     Mouth: Mucous membranes are moist.     Pharynx: Oropharynx is clear.  Eyes:     Conjunctiva/sclera: Conjunctivae normal.     Pupils: Pupils are equal, round, and reactive to light.  Cardiovascular:     Rate and Rhythm: Normal rate.  Pulmonary:     Effort: Pulmonary effort is normal.  Skin:    General: Skin is warm.     Capillary Refill: Capillary refill takes less than 2 seconds.  Neurological:     General: No focal deficit present.     Mental Status: She is alert and oriented to person, place, and time. Mental status is at baseline.  Psychiatric:        Mood and Affect: Mood normal.        Behavior: Behavior normal.        Thought Content:  Thought content normal.        Judgment: Judgment normal.     No results found for any visits on 05/04/23.     The ASCVD Risk score (Arnett DK, et al., 2019) failed to calculate for the following reasons:   The 2019 ASCVD risk score is only valid for ages 77 to 38    Assessment & Plan:   Problem List Items Addressed This Visit   None  Weight gain -     Phentermine HCl; Take 1 capsule (37.5 mg total) by mouth every morning.  Dispense: 30 capsule; Refill: 0  Body mass index (BMI) of 45.0 to 49.9 in adult [facility]) -     Phentermine HCl; Take 1 capsule (37.5 mg total) by mouth every morning.  Dispense: 30 capsule; Refill: 0   Pt doing well and tolerating Phentermine 37.5mg . Refilled today. Follow up in 6 weeks for CPE/labs No follow-ups on file.    Suzan Slick, MD

## 2023-05-10 ENCOUNTER — Telehealth: Payer: Self-pay

## 2023-05-10 ENCOUNTER — Other Ambulatory Visit (HOSPITAL_COMMUNITY): Payer: Self-pay

## 2023-05-10 NOTE — Telephone Encounter (Signed)
 Sorry but we do not do prior auth for the [employer].

## 2023-05-12 ENCOUNTER — Other Ambulatory Visit (HOSPITAL_COMMUNITY): Payer: Self-pay

## 2023-05-13 NOTE — Telephone Encounter (Signed)
 Prior auth for: PHENTERMINE Determination: Pending Auth #: [id number] Valid from: n/a

## 2023-05-16 ENCOUNTER — Encounter (HOSPITAL_COMMUNITY): Payer: Self-pay

## 2023-05-16 ENCOUNTER — Ambulatory Visit
Admission: EM | Admit: 2023-05-16 | Discharge: 2023-05-16 | Disposition: A | Discharge status: Home / Self Care | Attending: Family Medicine | Admitting: Family Medicine

## 2023-05-16 ENCOUNTER — Other Ambulatory Visit: Payer: Self-pay

## 2023-05-16 ENCOUNTER — Emergency Department (HOSPITAL_COMMUNITY)
Admission: EM | Admit: 2023-05-16 | Discharge: 2023-05-17 | Disposition: A | Discharge status: Home / Self Care | Attending: Emergency Medicine | Admitting: Emergency Medicine

## 2023-05-16 ENCOUNTER — Emergency Department (HOSPITAL_COMMUNITY)

## 2023-05-16 DIAGNOSIS — R079 Chest pain, unspecified: Secondary | ICD-10-CM | POA: Diagnosis not present

## 2023-05-16 DIAGNOSIS — R9431 Abnormal electrocardiogram [ECG] [EKG]: Secondary | ICD-10-CM | POA: Diagnosis not present

## 2023-05-16 DIAGNOSIS — I10 Essential (primary) hypertension: Secondary | ICD-10-CM | POA: Diagnosis not present

## 2023-05-16 DIAGNOSIS — E876 Hypokalemia: Secondary | ICD-10-CM | POA: Insufficient documentation

## 2023-05-16 LAB — CBC
HCT: 33.3 % — ABNORMAL LOW (ref 36.0–46.0)
Hemoglobin: 10.4 g/dL — ABNORMAL LOW (ref 12.0–15.0)
MCH: 22.8 pg — ABNORMAL LOW (ref 26.0–34.0)
MCHC: 31.2 g/dL (ref 30.0–36.0)
MCV: 73 fL — ABNORMAL LOW (ref 80.0–100.0)
Platelets: 477 10*3/uL — ABNORMAL HIGH (ref 150–400)
RBC: 4.56 MIL/uL (ref 3.87–5.11)
RDW: 19.8 % — ABNORMAL HIGH (ref 11.5–15.5)
WBC: 7.4 10*3/uL (ref 4.0–10.5)
nRBC: 0 % (ref 0.0–0.2)

## 2023-05-16 LAB — BASIC METABOLIC PANEL WITH GFR
Anion gap: 7 (ref 5–15)
BUN: 9 mg/dL (ref 6–20)
CO2: 26 mmol/L (ref 22–32)
Calcium: 9.1 mg/dL (ref 8.9–10.3)
Chloride: 107 mmol/L (ref 98–111)
Creatinine, Ser: 0.72 mg/dL (ref 0.44–1.00)
GFR, Estimated: >60 mL/min (ref >60)
Glucose, Bld: 109 mg/dL — ABNORMAL HIGH (ref 70–99)
Potassium: 3.3 mmol/L — ABNORMAL LOW (ref 3.5–5.1)
Sodium: 140 mmol/L (ref 135–145)

## 2023-05-16 LAB — TROPONIN I (HIGH SENSITIVITY): Troponin I (High Sensitivity): <2 ng/L (ref <18)

## 2023-05-16 LAB — HCG, SERUM, QUALITATIVE: Preg, Serum: NEGATIVE

## 2023-05-16 NOTE — ED Notes (Addendum)
 Patient is being discharged from the Urgent Care and sent to the Emergency Department via private vehicle . Per Marylene Land PA, patient is in need of higher level of care due to mid-chest pain. Patient is aware and verbalizes understanding of plan of care.  Vitals:   05/16/23 1644  BP: 105/67  Pulse: (!) 112  Resp: 18  Temp: 97.9 F (36.6 C)  SpO2: 99%

## 2023-05-16 NOTE — ED Provider Notes (Signed)
 [facility]    CSN: [id number] Arrival date & time: 05/16/23  1633      History   Chief Complaint Chief Complaint  Patient presents with   Chest Pain    HPI [name] is a 28 y.o. female.    Chest Pain Chest pain, central chest described as burning, started this morning around 9 AM, comes and goes lasts approximately 30 minutes.  Admits nausea.  Admits feeling a little short of breath feels she has to take a deeper breath Denies recent illness, rhinorrhea, nasal congestion, sore throat, cough, shortness of breath, vomiting, diarrhea.  Has had gastric surgery in the past.  Past Medical History:  Diagnosis Date   Anemia    iron   Asthma    spring   Back pain    Joint pain    Lower extremity edema    PCOS (polycystic ovarian syndrome)    Pre-diabetes    Prediabetes    Seasonal allergies    SOB (shortness of breath)     Patient Active Problem List   Diagnosis Date Noted   Eating disorder 08/09/2022   Morbid obesity (HCC) 08/09/2022   Obesity, Class III, BMI 40-49.9 (morbid obesity) (HCC) 08/09/2022   Status post laparoscopic sleeve gastrectomy (01/01/20) 03/09/2020   Mild obstructive sleep apnea 01/01/2020   Vitamin D deficiency 09/04/2019   PCOS (polycystic ovarian syndrome) 09/04/2019   Depression 09/04/2019   Class 3 severe obesity with serious comorbidity and body mass index (BMI) of 45.0 to 49.9 in adult [facility]) 09/04/2019   Prediabetes 09/04/2019   Absolute anemia 09/04/2019    Past Surgical History:  Procedure Laterality Date   FEMUR FRACTURE SURGERY Right 2010   LAPAROSCOPIC GASTRIC SLEEVE RESECTION N/A 01/01/2020   Procedure: LAPAROSCOPIC GASTRIC SLEEVE RESECTION;  Surgeon: Gaynelle Adu, MD;  Location: [facility];  Service: General;  Laterality: N/A;   UPPER GI ENDOSCOPY N/A 01/01/2020   Procedure: UPPER GI ENDOSCOPY;  Surgeon: Gaynelle Adu, MD;  Location: [facility];  Service: General;  Laterality: N/A;    OB History     Gravida  0    Para  0   Term  0   Preterm  0   AB  0   Living  0      SAB  0   IAB  0   Ectopic  0   Multiple  0   Live Births               Home Medications    Prior to Admission medications   Medication Sig Start Date End Date Taking? Authorizing Provider  albuterol (PROVENTIL) (2.5 MG/3ML) 0.083% nebulizer solution Take 3 mLs (2.5 mg total) by nebulization every 6 (six) hours as needed for wheezing or shortness of breath. 01/26/22 04/26/22  Theadora Rama Scales, PA-C  fexofenadine [facility] ALLERGY) 180 MG tablet Take 1 tablet (180 mg total) by mouth daily for 15 days. 04/27/23 05/12/23  Trevor Iha, FNP  iron polysaccharides (NIFEREX) 150 MG capsule TAKE 1 CAPSULE BY MOUTH EVERY DAY 08/16/22   Corinna Capra A, DO  phentermine 37.5 MG capsule Take 1 capsule (37.5 mg total) by mouth every morning. 05/04/23   Suzan Slick, MD    Family History Family History  Problem Relation Age of Onset   Diabetes Mother    Diabetes Father    Hypertension Father    Sleep apnea Father    Obesity Father    Cancer Other     Social  History Social History   Tobacco Use   Smoking status: Never    Passive exposure: Never   Smokeless tobacco: Never  Vaping Use   Vaping status: Never Used  Substance Use Topics   Alcohol use: Yes    Comment: socially   Drug use: No     Allergies   Contrast media [iodinated contrast media] and Isovue [iopamidol]   Review of Systems Review of Systems  Cardiovascular:  Positive for chest pain.     Physical Exam Triage Vital Signs ED Triage Vitals  Encounter Vitals Group     BP 05/16/23 1644 105/67     Systolic BP Percentile --      Diastolic BP Percentile --      Pulse Rate 05/16/23 1644 (!) 112     Resp 05/16/23 1644 18     Temp 05/16/23 1644 97.9 F (36.6 C)     Temp Source 05/16/23 1644 Oral     SpO2 05/16/23 1644 99 %     Weight 05/16/23 1645 255 lb (115.7 kg)     Height 05/16/23 1645 5\' 2"  (1.575 m)     Head Circumference --       Peak Flow --      Pain Score 05/16/23 1645 5     Pain Loc --      Pain Education --      Exclude from Growth Chart --    No data found.  Updated Vital Signs BP 105/67 (BP Location: Right Arm)   Pulse (!) 112   Temp 97.9 F (36.6 C) (Oral)   Resp 18   Ht 5\' 2"  (1.575 m)   Wt 255 lb (115.7 kg)   LMP 05/09/2023 (Exact Date)   SpO2 99%   BMI 46.64 kg/m   Visual Acuity Right Eye Distance:   Left Eye Distance:   Bilateral Distance:    Right Eye Near:   Left Eye Near:    Bilateral Near:     Physical Exam Vitals and nursing note reviewed.  Constitutional:      Appearance: She is obese. She is not ill-appearing.  HENT:     Head: Normocephalic and atraumatic.  Cardiovascular:     Rate and Rhythm: Regular rhythm. Tachycardia present.     Heart sounds: Normal heart sounds.  Pulmonary:     Breath sounds: Normal breath sounds. No decreased breath sounds.  Abdominal:     Palpations: Abdomen is soft.  Musculoskeletal:     Cervical back: Neck supple.  Skin:    General: Skin is warm and dry.  Neurological:     Mental Status: She is alert.      UC Treatments / Results  Labs (all labs ordered are listed, but only abnormal results are displayed) Labs Reviewed - No data to display  EKG   Radiology No results found.  Procedures Procedures (including critical care time)  Medications Ordered in UC Medications - No data to display  Initial Impression / Assessment and Plan / UC Course  I have reviewed the triage vital signs and the nursing notes.  Pertinent labs & imaging results that were available during my care of the patient were reviewed by me and considered in my medical decision making (see chart for details).     28 year old female reports chest pain since this a.m. episodic last 30 minutes associated with nausea and some shortness of breath.  She is tachycardic otherwise vital signs are stable.  Exam is nonspecific.  EKG independently viewed  by me  normal sinus rhythm prolonged QT T wave abnormalities, QTc the longer than prior EKGs recommend ED evaluation.  She will go to [facility] ED.  She was stable upon discharge Final Clinical Impressions(s) / UC Diagnoses   Final diagnoses:  Chest pain, unspecified type   Discharge Instructions   None    ED Prescriptions   None    PDMP not reviewed this encounter.   Meliton Rattan, [place] 05/16/23 1718

## 2023-05-16 NOTE — ED Notes (Signed)
 EKG given to [facility].

## 2023-05-16 NOTE — Discharge Instructions (Signed)
 Go to the emergency department for further evaluation and treatment.

## 2023-05-16 NOTE — ED Triage Notes (Addendum)
 Pt presents with complaints of mid-chest discomfort x 1 day. Pt states the pain is intermittent. Pt is also reporting congestion, bilateral ringing in ears, and nausea. OTC Tylenol taken with no noticeable improvement. Pt currently rates her overall pain a 5/10.

## 2023-05-16 NOTE — ED Triage Notes (Signed)
 Pt came in via POV d/t UC sending her here after her having CP with an abnormal EKG. Pt states her central CP started today & it is intermittent,, it feels like it is hard to breath & she will feel sleepy & then feel forgetful & then it will go away, then nauseous afterwards. Pt states this has happened about 4-5 times today. A/Ox4, denies pain currently during triage.

## 2023-05-17 LAB — TROPONIN I (HIGH SENSITIVITY): Troponin I (High Sensitivity): <2 ng/L (ref <18)

## 2023-05-17 MED ORDER — POTASSIUM CHLORIDE CRYS ER 20 MEQ PO TBCR
40.0000 meq | EXTENDED_RELEASE_TABLET | Freq: Once | ORAL | Status: AC
  Administered 2023-05-17: 40 meq via ORAL
  Filled 2023-05-17: qty 2

## 2023-05-17 NOTE — Discharge Instructions (Signed)
 Your workup tonight was reassuring.  Please follow-up with your primary care team for further evaluation as needed.  If you develop chest pain or other life-threatening symptoms please return to the emergency department.

## 2023-05-17 NOTE — ED Notes (Signed)
 Pt called x3, no answer.  KM

## 2023-05-17 NOTE — ED Notes (Signed)
 Pt currently in bathroom.  KM

## 2023-05-17 NOTE — ED Notes (Signed)
 Pt back in waiting, pt states she went to charge her phone.   KM

## 2023-05-17 NOTE — ED Provider Notes (Signed)
 [place] EMERGENCY DEPARTMENT AT [facility] Provider Note   CSN: [id number] Arrival date & time: 05/16/23  1739     History  Chief Complaint  Patient presents with   Abnormal EKG from UC   Chest Pain   Shortness of Breath    [name] is a 28 y.o. female.  Patient presents to the emergency room complaining of intermittent chest discomfort.  She states she has had episodes of this that have lasted for 30 minutes at a time throughout the day.  She endorses some mild nausea.  She feels like she breathes fast during these episodes and then after taking a deeper breath and calming down the chest pain goes away.  She denies fever, vomiting, shortness of breath, respiratory symptoms.  Past medical history significant for prediabetes, obesity, depression, PCOS   Chest Pain Associated symptoms: shortness of breath   Shortness of Breath Associated symptoms: chest pain        Home Medications Prior to Admission medications   Medication Sig Start Date End Date Taking? Authorizing Provider  albuterol (PROVENTIL) (2.5 MG/3ML) 0.083% nebulizer solution Take 3 mLs (2.5 mg total) by nebulization every 6 (six) hours as needed for wheezing or shortness of breath. 01/26/22 04/26/22  Theadora Rama Scales, PA-C  fexofenadine [facility] ALLERGY) 180 MG tablet Take 1 tablet (180 mg total) by mouth daily for 15 days. 04/27/23 05/12/23  Trevor Iha, FNP  iron polysaccharides (NIFEREX) 150 MG capsule TAKE 1 CAPSULE BY MOUTH EVERY DAY 08/16/22   Corinna Capra A, DO  phentermine 37.5 MG capsule Take 1 capsule (37.5 mg total) by mouth every morning. 05/04/23   Suzan Slick, MD      Allergies    Contrast media [iodinated contrast media] and Isovue [iopamidol]    Review of Systems   Review of Systems  Respiratory:  Positive for shortness of breath.   Cardiovascular:  Positive for chest pain.    Physical Exam Updated Vital Signs BP 99/66   Pulse 93   Temp 98.8 F (37.1 C) (Oral)    Resp 16   Ht 5\' 1"  (1.549 m)   Wt 115.7 kg   LMP 05/09/2023 (Exact Date)   SpO2 100%   BMI 48.18 kg/m  Physical Exam Vitals and nursing note reviewed.  Constitutional:      General: She is not in acute distress.    Appearance: She is well-developed.  HENT:     Head: Normocephalic and atraumatic.  Eyes:     Conjunctiva/sclera: Conjunctivae normal.  Cardiovascular:     Rate and Rhythm: Normal rate and regular rhythm.  Pulmonary:     Effort: Pulmonary effort is normal. No respiratory distress.     Breath sounds: Normal breath sounds.  Chest:     Chest wall: No tenderness.  Abdominal:     Palpations: Abdomen is soft.     Tenderness: There is no abdominal tenderness.  Musculoskeletal:        General: No swelling.     Cervical back: Neck supple.  Skin:    General: Skin is warm and dry.     Capillary Refill: Capillary refill takes less than 2 seconds.  Neurological:     Mental Status: She is alert.  Psychiatric:        Mood and Affect: Mood normal.     ED Results / Procedures / Treatments   Labs (all labs ordered are listed, but only abnormal results are displayed) Labs Reviewed  BASIC METABOLIC PANEL  WITH GFR - Abnormal; Notable for the following components:      Result Value   Potassium 3.3 (*)    Glucose, Bld 109 (*)    All other components within normal limits  CBC - Abnormal; Notable for the following components:   Hemoglobin 10.4 (*)    HCT 33.3 (*)    MCV 73.0 (*)    MCH 22.8 (*)    RDW 19.8 (*)    Platelets 477 (*)    All other components within normal limits  HCG, SERUM, QUALITATIVE  TROPONIN I (HIGH SENSITIVITY)  TROPONIN I (HIGH SENSITIVITY)    EKG None  Radiology DG Chest 2 View Result Date: 05/16/2023 CLINICAL DATA:  Chest pain EXAM: CHEST - 2 VIEW COMPARISON:  Chest x-ray 09/21/2019 FINDINGS: The heart size and mediastinal contours are within normal limits. Both lungs are clear. The visualized skeletal structures are unremarkable.  IMPRESSION: No active cardiopulmonary disease. Electronically Signed   By: Darliss Cheney M.D.   On: 05/16/2023 19:21    Procedures Procedures    Medications Ordered in ED Medications  potassium chloride SA (KLOR-CON M) CR tablet 40 mEq (40 mEq Oral Given 05/17/23 0145)    ED Course/ Medical Decision Making/ A&P                                 Medical Decision Making Amount and/or Complexity of Data Reviewed Labs: ordered. Radiology: ordered.  Risk Prescription drug management.   This patient presents to the ED for concern of chest discomfort, this involves an extensive number of treatment options, and is a complaint that carries with it a high risk of complications and morbidity.  The differential diagnosis includes ACS, pneumonia, anxiety, MSK pain, PE, others   Co morbidities that complicate the patient evaluation  Prediabetes, hypertension   Additional history obtained:   External records from outside source obtained and reviewed including urgent care notes   Lab Tests:  I Ordered, and personally interpreted labs.  The pertinent results include: Troponin less than 2 on initial and repeat, potassium 3.3, no leukocytosis, negative pregnancy test   Imaging Studies ordered:  I ordered imaging studies including chest x-ray I independently visualized and interpreted imaging which showed no acute findings I agree with the radiologist interpretation   Cardiac Monitoring: / EKG:  The patient was maintained on a cardiac monitor.  I personally viewed and interpreted the cardiac monitored which showed an underlying rhythm of: Sinus rhythm   Problem List / ED Course / Critical interventions / Medication management   I ordered medication including potassium for mild hypokalemia Reevaluation of the patient after these medicines showed that the patient stayed the same I have reviewed the patients home medicines and have made adjustments as needed   Social Determinants  of Health:  Social Drivers of Health with Concerns   Physical Activity: Insufficiently Active (04/06/2023)   Exercise Vital Sign    Days of Exercise per Week: 2 days    Minutes of Exercise per Session: 60 min  Stress: Stress Concern Present (04/06/2023)   [employer] of Occupational Health - Occupational Stress Questionnaire    Feeling of Stress : Very much  Social Connections: Moderately Isolated (04/06/2023)   Social Connection and Isolation Panel [NHANES]    Frequency of Communication with Friends and Family: More than three times a week    Frequency of Social Gatherings with Friends and Family: More than three  times a week    Attends Religious Services: More than 4 times per year    Active Member of Clubs or Organizations: No    Attends Banker Meetings: Not on file    Marital Status: Never married  Intimate Partner Violence: Not on file  Housing: High Risk (04/06/2023)   Housing Stability Vital Sign    Unable to Pay for Housing in the Last Year: Yes    Number of Times Moved in the Last Year: 0    Homeless in the Last Year: No  Utilities: Not on file  Health Literacy: Not on file      Test / Admission - Considered:  Patient with negative troponins x 2 and nonischemic EKG.  No signs of ACS at this time.  Patient with no chest pain or shortness of breath.  No clinical sign of pulmonary embolism.  Patient does endorse anxiety related to current stressors.  Feel this may be related to underlying anxiety.  At this time no indication for emergent workup or admission.  Plan to discharge home with return precautions.         Final Clinical Impression(s) / ED Diagnoses Final diagnoses:  Chest pain, unspecified type  Hypokalemia    Rx / DC Orders ED Discharge Orders     None         Pamala Duffel 05/17/23 0224    Gilda Crease, MD 05/18/23 [phone]

## 2023-05-24 NOTE — Telephone Encounter (Signed)
 Prior auth for: PHENTERMINE Determination: DENIED Reason: Your plan only covers this drug for a total of 3 months within a 365-day period for your health condition. We have denied your request for this drug because it is for longer treatment Auth #: [id number] Valid from: n/a

## 2023-06-06 ENCOUNTER — Other Ambulatory Visit: Payer: Self-pay | Admitting: Family Medicine

## 2023-06-06 DIAGNOSIS — Z6841 Body Mass Index (BMI) 40.0 and over, adult: Secondary | ICD-10-CM

## 2023-06-06 DIAGNOSIS — R635 Abnormal weight gain: Secondary | ICD-10-CM

## 2023-06-06 MED ORDER — PHENTERMINE HCL 37.5 MG PO CAPS: 37.5000 mg | ORAL_CAPSULE | ORAL | 0 refills | Status: DC

## 2023-06-14 ENCOUNTER — Encounter: Payer: Self-pay | Admitting: Family Medicine

## 2023-06-14 ENCOUNTER — Ambulatory Visit (INDEPENDENT_AMBULATORY_CARE_PROVIDER_SITE_OTHER): Admitting: Family Medicine

## 2023-06-14 VITALS — BP 114/76 | HR 93 | Temp 98.0°F | Resp 18 | Ht 61.0 in | Wt 257.8 lb

## 2023-06-14 DIAGNOSIS — R7302 Impaired glucose tolerance (oral): Secondary | ICD-10-CM

## 2023-06-14 DIAGNOSIS — Z0000 Encounter for general adult medical examination without abnormal findings: Secondary | ICD-10-CM | POA: Diagnosis not present

## 2023-06-14 DIAGNOSIS — Z136 Encounter for screening for cardiovascular disorders: Secondary | ICD-10-CM | POA: Diagnosis not present

## 2023-06-14 DIAGNOSIS — Z13220 Encounter for screening for lipoid disorders: Secondary | ICD-10-CM

## 2023-06-14 NOTE — Progress Notes (Signed)
 Complete physical exam  Patient: [name]   DOB: October 31, 1995   28 y.o. Female  MRN: [id number]  Subjective:    Chief Complaint  Patient presents with   Annual Exam    [name] is a 28 y.o. female who presents today for a complete physical exam. She reports consuming a general diet.  Walking 3 x a week  She generally feels well. She reports sleeping well. She does not have additional problems to discuss today.    Most recent fall risk assessment:    04/28/2022    4:01 PM  Fall Risk   Falls in the past year? 0  Number falls in past yr: 0  Injury with Fall? 0  Risk for fall due to : No Fall Risks  Follow up Falls evaluation completed     Most recent depression screenings:    05/04/2023    4:11 PM 04/28/2022    4:01 PM  PHQ 2/9 Scores  PHQ - 2 Score 0 0  PHQ- 9 Score 0 2    Vision:Within last year  Patient Active Problem List   Diagnosis Date Noted   Eating disorder 08/09/2022   Morbid obesity (HCC) 08/09/2022   Obesity, Class III, BMI 40-49.9 (morbid obesity) (HCC) 08/09/2022   Status post laparoscopic sleeve gastrectomy (01/01/20) 03/09/2020   Mild obstructive sleep apnea 01/01/2020   Vitamin D  deficiency 09/04/2019   PCOS (polycystic ovarian syndrome) 09/04/2019   Depression 09/04/2019   Class 3 severe obesity with serious comorbidity and body mass index (BMI) of 45.0 to 49.9 in adult (HCC) 09/04/2019   Prediabetes 09/04/2019   Absolute anemia 09/04/2019   Past Medical History:  Diagnosis Date   Anemia    iron    Asthma    spring   Back pain    Joint pain    Lower extremity edema    PCOS (polycystic ovarian syndrome)    Pre-diabetes    Prediabetes    Seasonal allergies    SOB (shortness of breath)    Past Surgical History:  Procedure Laterality Date   FEMUR FRACTURE SURGERY Right 2010   LAPAROSCOPIC GASTRIC SLEEVE RESECTION N/A 01/01/2020   Procedure: LAPAROSCOPIC GASTRIC SLEEVE RESECTION;  Surgeon: Aldean Hummingbird, MD;  Location: [facility];   Service: General;  Laterality: N/A;   UPPER GI ENDOSCOPY N/A 01/01/2020   Procedure: UPPER GI ENDOSCOPY;  Surgeon: Aldean Hummingbird, MD;  Location: [facility];  Service: General;  Laterality: N/A;   Social History   Socioeconomic History   Marital status: Single    Spouse name: Not on file   Number of children: Not on file   Years of education: Not on file   Highest education level: Bachelor's degree (e.g., BA, AB, BS)  Occupational History   Occupation: Geologist, engineering   Occupation: full time student  Tobacco Use   Smoking status: Never    Passive exposure: Never   Smokeless tobacco: Never  Vaping Use   Vaping status: Never Used  Substance and Sexual Activity   Alcohol use: Yes    Comment: socially   Drug use: No   Sexual activity: Yes    Birth control/protection: None  Other Topics Concern   Not on file  Social History Narrative   Not on file   Social Drivers of Health   Financial Resource Strain: Low Risk  (04/06/2023)   Overall Financial Resource Strain (CARDIA)    Difficulty of Paying Living Expenses: Not hard at all  Food Insecurity: No  Food Insecurity (04/06/2023)   Hunger Vital Sign    Worried About Running Out of Food in the Last Year: Never true    Ran Out of Food in the Last Year: Never true  Transportation Needs: No Transportation Needs (04/06/2023)   PRAPARE - Administrator, Civil Service (Medical): No    Lack of Transportation (Non-Medical): No  Physical Activity: Insufficiently Active (04/06/2023)   Exercise Vital Sign    Days of Exercise per Week: 2 days    Minutes of Exercise per Session: 60 min  Stress: Stress Concern Present (04/06/2023)   [employer] of Occupational Health - Occupational Stress Questionnaire    Feeling of Stress : Very much  Social Connections: Moderately Isolated (04/06/2023)   Social Connection and Isolation Panel [NHANES]    Frequency of Communication with Friends and Family: More than three times a week     Frequency of Social Gatherings with Friends and Family: More than three times a week    Attends Religious Services: More than 4 times per year    Active Member of [employer] or Organizations: No    Attends Engineer, structural: Not on file    Marital Status: Never married  Catering manager Violence: Not on file   Family Status  Relation Name Status   Mother  Alive   Father  Alive   Other  (Not Specified)  No partnership data on file   Allergies  Allergen Reactions   Contrast Media [Iodinated Contrast Media] Anaphylaxis   Isovue  [Iopamidol ]     Patient began sneezing, then c/o her throat closing up, SOB, wheezing      Patient Care Team: Manette Section, MD as PCP - General (Family Medicine)   Outpatient Medications Prior to Visit  Medication Sig   albuterol  (PROVENTIL ) (2.5 MG/3ML) 0.083% nebulizer solution Take 3 mLs (2.5 mg total) by nebulization every 6 (six) hours as needed for wheezing or shortness of breath.   fexofenadine  (ALLEGRA  ALLERGY) 180 MG tablet Take 1 tablet (180 mg total) by mouth daily for 15 days.   iron  polysaccharides (NIFEREX) 150 MG capsule TAKE 1 CAPSULE BY MOUTH EVERY DAY   phentermine  37.5 MG capsule Take 1 capsule (37.5 mg total) by mouth every morning.   No facility-administered medications prior to visit.    Review of Systems  All other systems reviewed and are negative.        Objective:     BP 114/76   Pulse 93   Temp 98 F (36.7 C) (Oral)   Resp 18   Ht 5\' 1"  (1.549 m)   Wt 257 lb 12.5 oz (116.9 kg)   LMP 05/09/2023 (Exact Date)   SpO2 100%   BMI 48.71 kg/m  BP Readings from Last 3 Encounters:  06/14/23 114/76  05/17/23 110/64  05/16/23 105/67      Physical Exam Vitals and nursing note reviewed.  Constitutional:      Appearance: Normal appearance. She is obese.  HENT:     Head: Normocephalic and atraumatic.     Right Ear: Tympanic membrane, ear canal and external ear normal.     Left Ear: Tympanic membrane, ear  canal and external ear normal.     Nose: Nose normal.     Mouth/Throat:     Mouth: Mucous membranes are moist.     Pharynx: Oropharynx is clear.  Eyes:     Conjunctiva/sclera: Conjunctivae normal.     Pupils: Pupils are equal, round, and reactive to  light.  Cardiovascular:     Rate and Rhythm: Normal rate and regular rhythm.     Pulses: Normal pulses.     Heart sounds: Normal heart sounds.  Pulmonary:     Effort: Pulmonary effort is normal.     Breath sounds: Normal breath sounds.  Abdominal:     General: Abdomen is flat. Bowel sounds are normal.  Skin:    General: Skin is warm.     Capillary Refill: Capillary refill takes less than 2 seconds.  Neurological:     General: No focal deficit present.     Mental Status: She is alert and oriented to person, place, and time. Mental status is at baseline.  Psychiatric:        Mood and Affect: Mood normal.        Behavior: Behavior normal.        Thought Content: Thought content normal.        Judgment: Judgment normal.     No results found for any visits on 06/14/23. Last CBC Lab Results  Component Value Date   WBC 7.4 05/16/2023   HGB 10.4 (L) 05/16/2023   HCT 33.3 (L) 05/16/2023   MCV 73.0 (L) 05/16/2023   MCH 22.8 (L) 05/16/2023   RDW 19.8 (H) 05/16/2023   PLT 477 (H) 05/16/2023   Last metabolic panel Lab Results  Component Value Date   GLUCOSE 109 (H) 05/16/2023   NA 140 05/16/2023   K 3.3 (L) 05/16/2023   CL 107 05/16/2023   CO2 26 05/16/2023   BUN 9 05/16/2023   CREATININE 0.72 05/16/2023   GFRNONAA >60 05/16/2023   CALCIUM 9.1 05/16/2023   PROT 6.8 06/09/2022   ALBUMIN 4.0 06/09/2022   LABGLOB 2.8 06/09/2022   AGRATIO 1.4 06/09/2022   BILITOT 0.3 06/09/2022   ALKPHOS 65 06/09/2022   AST 14 06/09/2022   ALT 10 06/09/2022   ANIONGAP 7 05/16/2023   Last lipids Lab Results  Component Value Date   CHOL 131 06/09/2022   HDL 44 06/09/2022   LDLCALC 76 06/09/2022   TRIG 51 06/09/2022   CHOLHDL 3.0  06/09/2022   Last hemoglobin A1c Lab Results  Component Value Date   HGBA1C 5.6 04/06/2023        Assessment & Plan:    Routine Health Maintenance and Physical Exam  Immunization History  Administered Date(s) Administered   PFIZER(Purple Top)SARS-COV-2 Vaccination 04/14/2019, 05/05/2019   PPD Test 02/18/2018    Health Maintenance  Topic Date Due   HPV VACCINES (1 - 3-dose series) Never done   DTaP/Tdap/Td (1 - Tdap) Never done   Pneumococcal Vaccine 55-62 Years old (1 of 2 - PCV) Never done   COVID-19 Vaccine (3 - 2024-25 season) 10/17/2022   INFLUENZA VACCINE  09/16/2023   Cervical Cancer Screening (Pap smear)  06/08/2025   Hepatitis C Screening  Completed   HIV Screening  Completed   Meningococcal B Vaccine  Aged Out    Discussed health benefits of physical activity, and encouraged her to engage in regular exercise appropriate for her age and condition.  Problem List Items Addressed This Visit   None  No follow-ups on file. Annual physical exam  Encounter for lipid screening for cardiovascular disease -     Lipid panel  Impaired glucose tolerance -     CBC with Differential/Platelet -     Comprehensive metabolic panel with GFR   Screening labs See in 1 year soon prn    Manette Section, MD

## 2023-06-15 ENCOUNTER — Encounter: Payer: Self-pay | Admitting: Family Medicine

## 2023-06-15 ENCOUNTER — Other Ambulatory Visit: Payer: Self-pay | Admitting: Family Medicine

## 2023-06-15 DIAGNOSIS — D508 Other iron deficiency anemias: Secondary | ICD-10-CM

## 2023-06-15 LAB — CBC WITH DIFFERENTIAL/PLATELET
Basophils Absolute: 0.1 10*3/uL (ref 0.0–0.2)
Basos: 1 %
EOS (ABSOLUTE): 0.2 10*3/uL (ref 0.0–0.4)
Eos: 4 %
Hematocrit: 33.4 % — ABNORMAL LOW (ref 34.0–46.6)
Hemoglobin: 10 g/dL — ABNORMAL LOW (ref 11.1–15.9)
Immature Grans (Abs): 0 10*3/uL (ref 0.0–0.1)
Immature Granulocytes: 0 %
Lymphocytes Absolute: 2.9 10*3/uL (ref 0.7–3.1)
Lymphs: 44 %
MCH: 22.1 pg — ABNORMAL LOW (ref 26.6–33.0)
MCHC: 29.9 g/dL — ABNORMAL LOW (ref 31.5–35.7)
MCV: 74 fL — ABNORMAL LOW (ref 79–97)
Monocytes Absolute: 0.7 10*3/uL (ref 0.1–0.9)
Monocytes: 11 %
Neutrophils Absolute: 2.7 10*3/uL (ref 1.4–7.0)
Neutrophils: 40 %
Platelets: 446 10*3/uL (ref 150–450)
RBC: 4.52 x10E6/uL (ref 3.77–5.28)
RDW: 16.6 % — ABNORMAL HIGH (ref 11.7–15.4)
WBC: 6.6 10*3/uL (ref 3.4–10.8)

## 2023-06-15 LAB — COMPREHENSIVE METABOLIC PANEL WITH GFR
ALT: 11 IU/L (ref 0–32)
AST: 12 IU/L (ref 0–40)
Albumin: 4.1 g/dL (ref 4.0–5.0)
Alkaline Phosphatase: 80 IU/L (ref 44–121)
BUN/Creatinine Ratio: 13 (ref 9–23)
BUN: 9 mg/dL (ref 6–20)
Bilirubin Total: <0.2 mg/dL (ref 0.0–1.2)
CO2: 24 mmol/L (ref 20–29)
Calcium: 9 mg/dL (ref 8.7–10.2)
Chloride: 103 mmol/L (ref 96–106)
Creatinine, Ser: 0.71 mg/dL (ref 0.57–1.00)
Globulin, Total: 2.9 g/dL (ref 1.5–4.5)
Glucose: 87 mg/dL (ref 70–99)
Potassium: 4.4 mmol/L (ref 3.5–5.2)
Sodium: 138 mmol/L (ref 134–144)
Total Protein: 7 g/dL (ref 6.0–8.5)
eGFR: 120 mL/min/{1.73_m2} (ref >59)

## 2023-06-15 LAB — LIPID PANEL
Chol/HDL Ratio: 3.3 ratio (ref 0.0–4.4)
Cholesterol, Total: 138 mg/dL (ref 100–199)
HDL: 42 mg/dL (ref >39)
LDL Chol Calc (NIH): 86 mg/dL (ref 0–99)
Triglycerides: 46 mg/dL (ref 0–149)
VLDL Cholesterol Cal: 10 mg/dL (ref 5–40)

## 2023-06-15 MED ORDER — FERROUS SULFATE 325 (65 FE) MG PO TBEC: 325.0000 mg | DELAYED_RELEASE_TABLET | Freq: Three times a day (TID) | ORAL | 1 refills | Status: DC

## 2023-06-30 ENCOUNTER — Telehealth: Payer: Self-pay

## 2023-06-30 ENCOUNTER — Ambulatory Visit: Admitting: Family Medicine

## 2023-06-30 DIAGNOSIS — R41840 Attention and concentration deficit: Secondary | ICD-10-CM

## 2023-06-30 NOTE — Telephone Encounter (Signed)
Referral sent to [facility].

## 2023-06-30 NOTE — Telephone Encounter (Signed)
 Copied from CRM [id number]. Topic: Referral - Request for Referral >> Jun 30, 2023 10:59 AM Baldemar Lev wrote: Did the patient discuss referral with their provider in the last year? Yes (If No - schedule appointment) (If Yes - send message)  Appointment offered? No.  Type of order/referral and detailed reason for visit: ADHD Screening   Preference of office, provider, location: Highest recommended   If referral order, have you been seen by this specialty before? No (If Yes, this issue or another issue? When? Where?  Can we respond through MyChart? Yes

## 2023-08-18 ENCOUNTER — Ambulatory Visit: Admitting: Family Medicine

## 2023-08-18 ENCOUNTER — Encounter: Payer: Self-pay | Admitting: Family Medicine

## 2023-08-18 VITALS — BP 113/76 | HR 88 | Temp 98.1°F | Resp 18 | Ht 61.0 in | Wt 259.0 lb

## 2023-08-18 DIAGNOSIS — F5089 Other specified eating disorder: Secondary | ICD-10-CM | POA: Diagnosis not present

## 2023-08-18 DIAGNOSIS — E66813 Obesity, class 3: Secondary | ICD-10-CM

## 2023-08-18 DIAGNOSIS — F3289 Other specified depressive episodes: Secondary | ICD-10-CM

## 2023-08-18 DIAGNOSIS — Z6841 Body Mass Index (BMI) 40.0 and over, adult: Secondary | ICD-10-CM

## 2023-08-18 MED ORDER — BUPROPION HCL ER (XL) 150 MG PO TB24: 150.0000 mg | ORAL_TABLET | Freq: Every day | ORAL | 0 refills | Status: DC

## 2023-08-18 NOTE — Progress Notes (Signed)
 Established Patient Office Visit  Subjective   Patient ID: [name], female    DOB: 04/27/1995  Age: 28 y.o. MRN: [id number]  Chief Complaint  Patient presents with   Follow-up    Patient is here to discuss starting an injectable for weight loss. Patient has completed Phentermine     HPI  Weight management Pt was seen by [facility] and started on Metformin  initially for weight management and prediabetes. She didn't see improvement with weight and she was seen by  me. I placed her on Phentermine  37.5mg  daily and she was on this for 12 weeks; Feb-April 2025. Starting weight 272 ending weight 257 lbs.  Pt has been diagnosed with binge eating disorder in 2021. She use to see nutritionist. She also has hx of depression. Currently not taking anything for mood.   Review of Systems  Constitutional:        Weight gain  All other systems reviewed and are negative.     Objective:     BP 113/76   Pulse 88   Temp 98.1 F (36.7 C) (Oral)   Resp 18   Ht 5' 1 (1.549 m)   Wt 259 lb (117.5 kg)   SpO2 100%   BMI 48.94 kg/m  BP Readings from Last 3 Encounters:  08/18/23 113/76  06/14/23 114/76  05/17/23 110/64      Physical Exam Vitals and nursing note reviewed.  Constitutional:      Appearance: Normal appearance. She is obese.  HENT:     Head: Normocephalic and atraumatic.     Right Ear: External ear normal.     Left Ear: External ear normal.     Nose: Nose normal.     Mouth/Throat:     Mouth: Mucous membranes are moist.     Pharynx: Oropharynx is clear.  Eyes:     Conjunctiva/sclera: Conjunctivae normal.     Pupils: Pupils are equal, round, and reactive to light.  Cardiovascular:     Rate and Rhythm: Normal rate.  Pulmonary:     Effort: Pulmonary effort is normal.  Skin:    General: Skin is warm.     Capillary Refill: Capillary refill takes less than 2 seconds.  Neurological:     General: No focal deficit present.     Mental Status: She is alert and oriented to  person, place, and time. Mental status is at baseline.  Psychiatric:        Mood and Affect: Mood normal.        Behavior: Behavior normal.        Thought Content: Thought content normal.        Judgment: Judgment normal.     No results found for any visits on 08/18/23.  Last hemoglobin A1c Lab Results  Component Value Date   HGBA1C 5.6 04/06/2023      The ASCVD Risk score (Arnett DK, et al., 2019) failed to calculate for the following reasons:   The 2019 ASCVD risk score is only valid for ages 11 to 65    Assessment & Plan:   Problem List Items Addressed This Visit   None Class 3 severe obesity with serious comorbidity and body mass index (BMI) of 45.0 to 49.9 in adult -     buPROPion HCl ER (XL); Take 1 tablet (150 mg total) by mouth daily.  Dispense: 30 tablet; Refill: 0  Other disorder of eating -     buPROPion HCl ER (XL); Take 1 tablet (150 mg total)  by mouth daily.  Dispense: 30 tablet; Refill: 0  Other depression -     buPROPion HCl ER (XL); Take 1 tablet (150 mg total) by mouth daily.  Dispense: 30 tablet; Refill: 0   Pt with elevated BMI and binge eating with hx of depression, next best option to help with all conditions ie Wellbutrin 150mg  daily. To start trial and see back in 4 weeks.   No follow-ups on file.    Torrence CINDERELLA Barrier, MD

## 2023-09-09 ENCOUNTER — Other Ambulatory Visit: Payer: Self-pay | Admitting: Family Medicine

## 2023-09-09 DIAGNOSIS — F3289 Other specified depressive episodes: Secondary | ICD-10-CM

## 2023-09-09 DIAGNOSIS — Z6841 Body Mass Index (BMI) 40.0 and over, adult: Secondary | ICD-10-CM

## 2023-09-09 DIAGNOSIS — F5089 Other specified eating disorder: Secondary | ICD-10-CM

## 2023-09-14 ENCOUNTER — Encounter: Admitting: Obstetrics and Gynecology

## 2023-09-15 ENCOUNTER — Ambulatory Visit (INDEPENDENT_AMBULATORY_CARE_PROVIDER_SITE_OTHER): Admitting: Family Medicine

## 2023-09-15 VITALS — BP 129/78 | HR 81 | Temp 98.2°F | Resp 18 | Ht 61.0 in | Wt 259.0 lb

## 2023-09-15 DIAGNOSIS — Z6841 Body Mass Index (BMI) 40.0 and over, adult: Secondary | ICD-10-CM

## 2023-09-15 DIAGNOSIS — E66813 Obesity, class 3: Secondary | ICD-10-CM | POA: Diagnosis not present

## 2023-09-15 MED ORDER — TOPIRAMATE 25 MG PO TABS: ORAL_TABLET | ORAL | 1 refills | Status: DC

## 2023-09-15 NOTE — Progress Notes (Signed)
   Established Patient Office Visit  Subjective   Patient ID: [name], female    DOB: 09-18-1995  Age: 28 y.o. MRN: [id number]  Chief Complaint  Patient presents with   Follow-up    Patient is here for a 1 month follow up for weight management     HPI  Weight management Pt was started on Bupropion  150mg  daily for weight and mood. She feels like she was more irritable with the medicine. She would like an alternative.   Review of Systems  Psychiatric/Behavioral:         Irritability  All other systems reviewed and are negative.     Objective:     BP 129/78   Pulse 81   Temp 98.2 F (36.8 C) (Oral)   Resp 18   Ht 5' 1 (1.549 m)   Wt 259 lb (117.5 kg)   SpO2 100%   BMI 48.94 kg/m  BP Readings from Last 3 Encounters:  09/15/23 129/78  08/18/23 113/76  06/14/23 114/76      Physical Exam Vitals and nursing note reviewed.  Constitutional:      Appearance: Normal appearance. She is obese.  HENT:     Head: Normocephalic and atraumatic.     Right Ear: External ear normal.     Left Ear: External ear normal.     Nose: Nose normal.     Mouth/Throat:     Mouth: Mucous membranes are moist.     Pharynx: Oropharynx is clear.  Eyes:     Conjunctiva/sclera: Conjunctivae normal.     Pupils: Pupils are equal, round, and reactive to light.  Cardiovascular:     Rate and Rhythm: Normal rate.  Pulmonary:     Effort: Pulmonary effort is normal.  Skin:    General: Skin is warm.     Capillary Refill: Capillary refill takes less than 2 seconds.  Neurological:     General: No focal deficit present.     Mental Status: She is alert and oriented to person, place, and time. Mental status is at baseline.  Psychiatric:        Mood and Affect: Mood normal.        Behavior: Behavior normal.        Thought Content: Thought content normal.        Judgment: Judgment normal.     No results found for any visits on 09/15/23.     The ASCVD Risk score (Arnett DK, et al.,  2019) failed to calculate for the following reasons:   The 2019 ASCVD risk score is only valid for ages 36 to 71    Assessment & Plan:   Problem List Items Addressed This Visit   None Class 3 severe obesity with serious comorbidity and body mass index (BMI) of 45.0 to 49.9 in adult -     Topiramate ; Take 1 tab po at night for the first 7 days then go to BID  Dispense: 60 tablet; Refill: 1   Pt with more irritability since starting Bupropion  150mg  daily. Would like an alternative. Switch to Topiramate  25mg  po nightly x7 days then BID See back in 4 weeks follow up.  No follow-ups on file.    Torrence CINDERELLA Barrier, MD

## 2023-10-12 ENCOUNTER — Ambulatory Visit
Admission: EM | Admit: 2023-10-12 | Discharge: 2023-10-12 | Disposition: A | Discharge status: Home / Self Care | Attending: Family Medicine | Admitting: Family Medicine

## 2023-10-12 DIAGNOSIS — R6889 Other general symptoms and signs: Secondary | ICD-10-CM

## 2023-10-12 DIAGNOSIS — R509 Fever, unspecified: Secondary | ICD-10-CM | POA: Diagnosis not present

## 2023-10-12 LAB — POCT INFLUENZA A/B
Influenza A, POC: NEGATIVE
Influenza B, POC: NEGATIVE

## 2023-10-12 LAB — POC SARS CORONAVIRUS 2 AG -  ED: SARS Coronavirus 2 Ag: NEGATIVE

## 2023-10-12 MED ORDER — ACETAMINOPHEN 500 MG PO TABS
1000.0000 mg | ORAL_TABLET | Freq: Once | ORAL | Status: AC
  Administered 2023-10-12: 1000 mg via ORAL

## 2023-10-12 NOTE — ED Provider Notes (Signed)
 [name] [facility]    CSN: [id number] Arrival date & time: 10/12/23  0931      History   Chief Complaint Chief Complaint  Patient presents with   bodyaches   Facial Pain   Chest Pain   Shortness of Breath    HPI [name] is a 28 y.o. female.   HPI 28 year old female presents with chest congestion, chills, shortness of breath, and sinus pain since last night.  Patient reports taking Benadryl  OTC.  PMH significant for morbid/severe obesity, PCOS.  And free diabetes.  Past Medical History:  Diagnosis Date   Anemia    iron    Asthma    spring   Back pain    Joint pain    Lower extremity edema    PCOS (polycystic ovarian syndrome)    Pre-diabetes    Prediabetes    Seasonal allergies    SOB (shortness of breath)     Patient Active Problem List   Diagnosis Date Noted   Eating disorder 08/09/2022   Morbid obesity (HCC) 08/09/2022   Obesity, Class III, BMI 40-49.9 (morbid obesity) 08/09/2022   Status post laparoscopic sleeve gastrectomy (01/01/20) 03/09/2020   Mild obstructive sleep apnea 01/01/2020   Vitamin D  deficiency 09/04/2019   PCOS (polycystic ovarian syndrome) 09/04/2019   Depression 09/04/2019   Class 3 severe obesity with serious comorbidity and body mass index (BMI) of 45.0 to 49.9 in adult 09/04/2019   Prediabetes 09/04/2019   Absolute anemia 09/04/2019    Past Surgical History:  Procedure Laterality Date   FEMUR FRACTURE SURGERY Right 2010   LAPAROSCOPIC GASTRIC SLEEVE RESECTION N/A 01/01/2020   Procedure: LAPAROSCOPIC GASTRIC SLEEVE RESECTION;  Surgeon: Tanda Locus, MD;  Location: [facility];  Service: General;  Laterality: N/A;   UPPER GI ENDOSCOPY N/A 01/01/2020   Procedure: UPPER GI ENDOSCOPY;  Surgeon: Tanda Locus, MD;  Location: [facility];  Service: General;  Laterality: N/A;    OB History     Gravida  0   Para  0   Term  0   Preterm  0   AB  0   Living  0      SAB  0   IAB  0   Ectopic  0   Multiple  0   Live  Births               Home Medications    Prior to Admission medications   Medication Sig Start Date End Date Taking? Authorizing Provider  albuterol  (PROVENTIL ) (2.5 MG/3ML) 0.083% nebulizer solution Take 3 mLs (2.5 mg total) by nebulization every 6 (six) hours as needed for wheezing or shortness of breath. 01/26/22 09/15/23  Joesph Shaver Scales, PA-C  ferrous sulfate  325 (65 FE) MG EC tablet Take 1 tablet (325 mg total) by mouth 3 (three) times daily with meals. 06/15/23   Colette Torrence GRADE, MD  fexofenadine  (ALLEGRA  ALLERGY) 180 MG tablet Take 1 tablet (180 mg total) by mouth daily for 15 days. 04/27/23 09/15/23  Teddy Sharper, FNP  topiramate  (TOPAMAX ) 25 MG tablet Take 1 tab po at night for the first 7 days then go to BID 09/15/23   Colette Torrence GRADE, MD    Family History Family History  Problem Relation Age of Onset   Diabetes Mother    Diabetes Father    Hypertension Father    Sleep apnea Father    Obesity Father    Cancer Other     Social History Social History  Tobacco Use   Smoking status: Never    Passive exposure: Never   Smokeless tobacco: Never  Vaping Use   Vaping status: Never Used  Substance Use Topics   Alcohol use: Yes    Comment: socially   Drug use: No     Allergies   Contrast media [iodinated contrast media], Isovue  [iopamidol ], and Wellbutrin  [bupropion ]   Review of Systems Review of Systems  Constitutional:  Positive for chills.  HENT:  Positive for sinus pain.   Respiratory:  Positive for shortness of breath.   Musculoskeletal:  Positive for arthralgias and myalgias.  All other systems reviewed and are negative.    Physical Exam Triage Vital Signs ED Triage Vitals  Encounter Vitals Group     BP      Girls Systolic BP Percentile      Girls Diastolic BP Percentile      Boys Systolic BP Percentile      Boys Diastolic BP Percentile      Pulse      Resp      Temp      Temp src      SpO2      Weight      Height      Head  Circumference      Peak Flow      Pain Score      Pain Loc      Pain Education      Exclude from Growth Chart    No data found.  Updated Vital Signs BP 97/63   Pulse (!) 113   Temp 100.2 F (37.9 C)   Resp 19   LMP 09/28/2023   SpO2 98%    Physical Exam Vitals and nursing note reviewed.  Constitutional:      Appearance: Normal appearance. She is obese. She is ill-appearing.  HENT:     Head: Normocephalic and atraumatic.     Mouth/Throat:     Mouth: Mucous membranes are moist.     Pharynx: Oropharynx is clear.  Eyes:     Extraocular Movements: Extraocular movements intact.     Conjunctiva/sclera: Conjunctivae normal.     Pupils: Pupils are equal, round, and reactive to light.  Cardiovascular:     Rate and Rhythm: Normal rate and regular rhythm.     Pulses: Normal pulses.     Heart sounds: Normal heart sounds.  Pulmonary:     Effort: Pulmonary effort is normal.     Breath sounds: Normal breath sounds. No wheezing, rhonchi or rales.  Musculoskeletal:        General: Normal range of motion.  Skin:    General: Skin is warm and dry.  Neurological:     General: No focal deficit present.     Mental Status: She is alert and oriented to person, place, and time. Mental status is at baseline.  Psychiatric:        Mood and Affect: Mood normal.        Behavior: Behavior normal.      UC Treatments / Results  Labs (all labs ordered are listed, but only abnormal results are displayed) Labs Reviewed  POC SARS CORONAVIRUS 2 AG -  ED  POCT INFLUENZA A/B    EKG   Radiology No results found.  Procedures Procedures (including critical care time)  Medications Ordered in UC Medications  acetaminophen  (TYLENOL ) tablet 1,000 mg (1,000 mg Oral Given 10/12/23 1036)    Initial Impression / Assessment and Plan / UC Course  I have reviewed the triage vital signs and the nursing notes.  Pertinent labs & imaging results that were available during my care of the patient were  reviewed by me and considered in my medical decision making (see chart for details).     MDM: 1.  Fever, unspecified-Tylenol  1 g given once in clinic and prior to discharge.  Advised patient may take OTC Tylenol  1000 mg every 6 hours for fever (oral temperature greater than 100.3). Encouraged increase daily water intake 64 ounces per day while taking his medications. 2.  Influenza like symptoms-COVID-19 and influenza A/B- today.  Advised if symptoms worsen and/or unresolved please follow-up with your PCP or here for further evaluation.  Patient discharged home, hemodynamically stable.  Work note provided to patient prior to discharge today. Final Clinical Impressions(s) / UC Diagnoses   Final diagnoses:  Influenza-like symptoms  Fever, unspecified     Discharge Instructions      Advised patient may take OTC Tylenol  1000 mg every 6 hours for fever (oral temperature greater than 100.3).  Encouraged increase daily water intake 64 ounces per day while taking his medications.  Advised if symptoms worsen and/or unresolved please follow-up with your PCP or here for further evaluation.     ED Prescriptions   None    PDMP not reviewed this encounter.   Teddy Sharper, FNP 10/12/23 1057

## 2023-10-12 NOTE — Discharge Instructions (Addendum)
 Advised patient may take OTC Tylenol  1000 mg every 6 hours for fever (oral temperature greater than 100.3).  Encouraged increase daily water intake 64 ounces per day while taking his medications.  Advised if symptoms worsen and/or unresolved please follow-up with your PCP or here for further evaluation.

## 2023-10-12 NOTE — ED Triage Notes (Signed)
 Pt presents to uc with co cp, chills, sob, and sinus pain and congestion since last night. Pt reports she has taken benadryl  otc.

## 2023-10-13 ENCOUNTER — Ambulatory Visit: Admitting: Family Medicine

## 2023-10-25 ENCOUNTER — Encounter: Payer: Self-pay | Admitting: Family Medicine

## 2023-10-25 ENCOUNTER — Ambulatory Visit: Admitting: Family Medicine

## 2023-10-25 VITALS — BP 113/79 | HR 86 | Temp 98.0°F | Ht 62.0 in | Wt 260.2 lb

## 2023-10-25 DIAGNOSIS — D508 Other iron deficiency anemias: Secondary | ICD-10-CM | POA: Diagnosis not present

## 2023-10-25 DIAGNOSIS — E66813 Obesity, class 3: Secondary | ICD-10-CM | POA: Diagnosis not present

## 2023-10-25 DIAGNOSIS — J4520 Mild intermittent asthma, uncomplicated: Secondary | ICD-10-CM

## 2023-10-25 DIAGNOSIS — Z23 Encounter for immunization: Secondary | ICD-10-CM

## 2023-10-25 DIAGNOSIS — Z6841 Body Mass Index (BMI) 40.0 and over, adult: Secondary | ICD-10-CM

## 2023-10-25 MED ORDER — FERROUS SULFATE 325 (65 FE) MG PO TBEC
325.0000 mg | DELAYED_RELEASE_TABLET | Freq: Three times a day (TID) | ORAL | 1 refills | Status: AC
Start: 2023-10-25 — End: ?

## 2023-10-25 MED ORDER — TOPIRAMATE 25 MG PO TABS: 25.0000 mg | ORAL_TABLET | Freq: Two times a day (BID) | ORAL | 1 refills | Status: DC

## 2023-10-25 MED ORDER — ALBUTEROL SULFATE (2.5 MG/3ML) 0.083% IN NEBU: 2.5000 mg | INHALATION_SOLUTION | Freq: Four times a day (QID) | RESPIRATORY_TRACT | 2 refills | Status: AC | PRN

## 2023-10-25 NOTE — Progress Notes (Signed)
 Established Patient Office Visit  Subjective   Patient ID: [name], female    DOB: 1995/08/20  Age: 28 y.o. MRN: [id number]  Chief Complaint  Patient presents with   Weight Check    HPI  Weight management Pt was started on Topamax  25 mg BID 4 weeks ago. She says it cut back on her cravings.  She says it helped with mood. She needs this refilled.  Flowsheet Row Office Visit from 05/04/2023 in [facility] Total Score 0    Pt would like flu vaccine.  Pt has hx of iron  deficiency anemia and allergic asthma. Needs refills on her iron  supplements and albuterol  nebulizer.    Review of Systems  All other systems reviewed and are negative.     Objective:     BP 113/79 (BP Location: Left Arm, Patient Position: Sitting, Cuff Size: Normal)   Pulse 86   Temp 98 F (36.7 C) (Oral)   Ht 5' 2 (1.575 m)   Wt 260 lb 3.2 oz (118 kg)   LMP 09/28/2023   SpO2 99%   BMI 47.59 kg/m    Physical Exam Vitals and nursing note reviewed.  Constitutional:      Appearance: Normal appearance. She is obese.  HENT:     Head: Normocephalic and atraumatic.     Right Ear: External ear normal.     Left Ear: External ear normal.     Nose: Nose normal.     Mouth/Throat:     Mouth: Mucous membranes are moist.     Pharynx: Oropharynx is clear.  Eyes:     Conjunctiva/sclera: Conjunctivae normal.     Pupils: Pupils are equal, round, and reactive to light.  Cardiovascular:     Rate and Rhythm: Normal rate.  Pulmonary:     Effort: Pulmonary effort is normal.  Skin:    General: Skin is warm.     Capillary Refill: Capillary refill takes less than 2 seconds.  Neurological:     General: No focal deficit present.     Mental Status: She is alert and oriented to person, place, and time. Mental status is at baseline.  Psychiatric:        Mood and Affect: Mood normal.        Behavior: Behavior normal.        Thought Content: Thought content normal.         Judgment: Judgment normal.     No results found for any visits on 10/25/23.    The ASCVD Risk score (Arnett DK, et al., 2019) failed to calculate for the following reasons:   The 2019 ASCVD risk score is only valid for ages 45 to 82    Assessment & Plan:   Problem List Items Addressed This Visit       Other   Absolute anemia   Other Visit Diagnoses       Immunization due    -  Primary   Relevant Orders   Flu vaccine trivalent PF, 6mos and older(Flulaval,Afluria,Fluarix,Fluzone) (Completed)     Class 3 severe obesity with serious comorbidity and body mass index (BMI) of 45.0 to 49.9 in adult -     Topiramate ; Take 1 tablet (25 mg total) by mouth 2 (two) times daily.  Dispense: 180 tablet; Refill: 1  Other iron  deficiency anemia -     Ferrous Sulfate ; Take 1 tablet (325 mg total) by mouth 3 (three) times daily with meals.  Dispense: 90 tablet; Refill: 1  Immunization due -     Flu vaccine trivalent PF, 6mos and older(Flulaval,Afluria,Fluarix,Fluzone)  Mild intermittent extrinsic asthma without complication -     Albuterol  Sulfate; Take 3 mLs (2.5 mg total) by nebulization every 6 (six) hours as needed for wheezing or shortness of breath.  Dispense: 360 mL; Refill: 2   Pt doing well with Topamax  25mg  BID. Refilled today for obesity and weight management. She needed refills on chronic conditions iron  deficiency and allergic asthma.  Flu vaccine See in 3 months sooner prn.  No follow-ups on file.    Torrence CINDERELLA Barrier, MD

## 2023-10-26 ENCOUNTER — Other Ambulatory Visit: Payer: Self-pay | Admitting: Medical Genetics

## 2024-02-27 ENCOUNTER — Ambulatory Visit: Admitting: Family Medicine

## 2024-03-16 ENCOUNTER — Encounter: Payer: Self-pay | Admitting: Family Medicine

## 2024-03-20 ENCOUNTER — Ambulatory Visit: Admitting: Family Medicine

## 2024-03-21 ENCOUNTER — Encounter: Payer: Self-pay | Admitting: Family Medicine

## 2024-03-21 ENCOUNTER — Telehealth: Admitting: Family Medicine

## 2024-03-21 DIAGNOSIS — Z6841 Body Mass Index (BMI) 40.0 and over, adult: Secondary | ICD-10-CM

## 2024-03-21 MED ORDER — TOPIRAMATE 25 MG PO TABS: ORAL_TABLET | ORAL | 0 refills | Status: AC

## 2024-03-21 NOTE — Progress Notes (Signed)
Never connected with patient.

## 2024-03-21 NOTE — Progress Notes (Signed)
 Virtual Visit via Video Note  I connected with [name] on 03/21/24 at  1:10 PM EST by a video enabled telemedicine application and verified that I am speaking with the correct person using two identifiers.  Location: Patient: Home in [place] Provider: Home in [place] (weather)   I discussed the limitations of evaluation and management by telemedicine and the availability of in person appointments. The patient expressed understanding and agreed to proceed.  History of Present Illness:   Weight management Pt has been on Topiramate  25mg  BID. She says at first it was suppressing her appetite but now, she feels like she is immune to it. It's not helping to suppress her appetite. She would like other alternatives to weight loss.  Observations/Objective: Physical Exam Nursing note reviewed.  Constitutional:      Appearance: Normal appearance. She is obese.  HENT:     Head: Normocephalic and atraumatic.     Right Ear: External ear normal.     Left Ear: External ear normal.  Eyes:     Extraocular Movements: Extraocular movements intact.  Pulmonary:     Effort: Pulmonary effort is normal.  Neurological:     General: No focal deficit present.     Mental Status: She is alert and oriented to person, place, and time. Mental status is at baseline.  Psychiatric:        Mood and Affect: Mood normal.        Behavior: Behavior normal.        Thought Content: Thought content normal.        Judgment: Judgment normal.     Assessment and Plan: Class 3 severe obesity with serious comorbidity and body mass index (BMI) of 45.0 to 49.9 in adult [facility]) -     Topiramate ; Take 1 tab po in the morning and 2 tabs po at night  Dispense: 90 tablet; Refill: 0     Follow Up Instructions:   pt had suppression of appetite when first starting on Topiramate  but not helping her now. Discussed other options for weight loss. She doesn't meet any criteria for the GLP-1s. Will try increasing the Topiramate  from 25mg   BID to 25mg  1 tab in the morning and 2 at night. To follow up in 4 weeks. To get A1c at that time  I discussed the assessment and treatment plan with the patient. The patient was provided an opportunity to ask questions and all were answered. The patient agreed with the plan and demonstrated an understanding of the instructions.   The patient was advised to call back or seek an in-person evaluation if the symptoms worsen or if the condition fails to improve as anticipated.  I provided 8 minutes of non-face-to-face time during this encounter.   Torrence CINDERELLA Barrier, MD
